# Patient Record
Sex: Male | Born: 1965 | Race: White | Hispanic: No | Marital: Married | Smoking: Never smoker
Health system: Southern US, Community
[De-identification: ages and names within clinical notes are randomized; demographics above are authoritative.]

## PROBLEM LIST (undated history)

## (undated) DIAGNOSIS — E785 Hyperlipidemia, unspecified: Secondary | ICD-10-CM

## (undated) DIAGNOSIS — I10 Essential (primary) hypertension: Secondary | ICD-10-CM

## (undated) HISTORY — PX: POLYPECTOMY: SHX149

## (undated) HISTORY — DX: Hyperlipidemia, unspecified: E78.5

## (undated) HISTORY — DX: Essential (primary) hypertension: I10

## (undated) HISTORY — PX: OTHER SURGICAL HISTORY: SHX169

---

## 2005-05-31 ENCOUNTER — Ambulatory Visit: Payer: Self-pay | Admitting: Internal Medicine

## 2005-06-07 ENCOUNTER — Ambulatory Visit: Payer: Self-pay | Admitting: Internal Medicine

## 2005-06-16 ENCOUNTER — Encounter: Payer: Self-pay | Admitting: Internal Medicine

## 2005-06-16 ENCOUNTER — Ambulatory Visit: Payer: Self-pay

## 2005-07-05 ENCOUNTER — Ambulatory Visit: Payer: Self-pay | Admitting: Internal Medicine

## 2005-07-29 ENCOUNTER — Ambulatory Visit: Payer: Self-pay | Admitting: Internal Medicine

## 2007-06-14 DIAGNOSIS — I10 Essential (primary) hypertension: Secondary | ICD-10-CM | POA: Insufficient documentation

## 2007-06-25 ENCOUNTER — Ambulatory Visit: Payer: Self-pay | Admitting: Internal Medicine

## 2007-06-25 LAB — CONVERTED CEMR LAB
ALT: 40 units/L (ref 0–53)
AST: 35 units/L (ref 0–37)
Albumin: 4.3 g/dL (ref 3.5–5.2)
Alkaline Phosphatase: 74 units/L (ref 39–117)
BUN: 12 mg/dL (ref 6–23)
Basophils Absolute: 0 10*3/uL (ref 0.0–0.1)
Basophils Relative: 0.6 % (ref 0.0–1.0)
Blood in Urine, dipstick: NEGATIVE
CO2: 31 meq/L (ref 19–32)
Chloride: 101 meq/L (ref 96–112)
Cholesterol: 232 mg/dL (ref 0–200)
Creatinine, Ser: 0.8 mg/dL (ref 0.4–1.5)
Glucose, Urine, Semiquant: NEGATIVE
HCT: 45.5 % (ref 39.0–52.0)
MCHC: 35 g/dL (ref 30.0–36.0)
Monocytes Relative: 9 % (ref 3.0–11.0)
RBC: 4.87 M/uL (ref 4.22–5.81)
RDW: 11.8 % (ref 11.5–14.6)
Total Bilirubin: 1.1 mg/dL (ref 0.3–1.2)
Total CHOL/HDL Ratio: 4.4
Triglycerides: 106 mg/dL (ref 0–149)
Urobilinogen, UA: 0.2
VLDL: 21 mg/dL (ref 0–40)
WBC Urine, dipstick: NEGATIVE
pH: 8.5

## 2007-07-02 ENCOUNTER — Ambulatory Visit: Payer: Self-pay | Admitting: Internal Medicine

## 2007-07-02 DIAGNOSIS — M1990 Unspecified osteoarthritis, unspecified site: Secondary | ICD-10-CM | POA: Insufficient documentation

## 2007-07-02 DIAGNOSIS — E785 Hyperlipidemia, unspecified: Secondary | ICD-10-CM

## 2009-07-22 ENCOUNTER — Ambulatory Visit: Payer: Self-pay | Admitting: Internal Medicine

## 2009-07-22 LAB — CONVERTED CEMR LAB
AST: 35 units/L (ref 0–37)
Alkaline Phosphatase: 73 units/L (ref 39–117)
Basophils Absolute: 0 10*3/uL (ref 0.0–0.1)
Blood in Urine, dipstick: NEGATIVE
Calcium: 9.3 mg/dL (ref 8.4–10.5)
GFR calc non Af Amer: 112.89 mL/min (ref >60)
Glucose, Bld: 84 mg/dL (ref 70–99)
HDL: 47.5 mg/dL (ref >39.00)
Hemoglobin: 15.5 g/dL (ref 13.0–17.0)
Ketones, urine, test strip: NEGATIVE
Lymphocytes Relative: 29.2 % (ref 12.0–46.0)
Monocytes Relative: 7.3 % (ref 3.0–12.0)
Neutro Abs: 3.5 10*3/uL (ref 1.4–7.7)
Nitrite: NEGATIVE
Platelets: 201 10*3/uL (ref 150.0–400.0)
Protein, U semiquant: NEGATIVE
RDW: 11.7 % (ref 11.5–14.6)
Sodium: 138 meq/L (ref 135–145)
Specific Gravity, Urine: 1.02
Total Bilirubin: 0.9 mg/dL (ref 0.3–1.2)
Total CHOL/HDL Ratio: 5
Triglycerides: 134 mg/dL (ref 0.0–149.0)
Urobilinogen, UA: 0.2
VLDL: 26.8 mg/dL (ref 0.0–40.0)

## 2009-07-28 ENCOUNTER — Ambulatory Visit: Payer: Self-pay | Admitting: Internal Medicine

## 2010-01-13 ENCOUNTER — Ambulatory Visit: Payer: Self-pay | Admitting: Internal Medicine

## 2010-01-13 LAB — CONVERTED CEMR LAB
Direct LDL: 151.5 mg/dL
HDL: 54 mg/dL (ref >39.00)
Total Bilirubin: 1 mg/dL (ref 0.3–1.2)
Total CHOL/HDL Ratio: 4
Triglycerides: 160 mg/dL — ABNORMAL HIGH (ref 0.0–149.0)

## 2010-01-20 ENCOUNTER — Ambulatory Visit: Payer: Self-pay | Admitting: Internal Medicine

## 2010-01-20 LAB — CONVERTED CEMR LAB
Cholesterol, target level: 200 mg/dL
HDL goal, serum: 40 mg/dL

## 2010-06-07 ENCOUNTER — Ambulatory Visit: Payer: Self-pay | Admitting: Internal Medicine

## 2010-06-07 LAB — CONVERTED CEMR LAB
ALT: 37 units/L (ref 0–53)
Alkaline Phosphatase: 73 units/L (ref 39–117)
Bilirubin, Direct: 0.1 mg/dL (ref 0.0–0.3)
Cholesterol: 156 mg/dL (ref 0–200)
Total Protein: 6.9 g/dL (ref 6.0–8.3)

## 2010-06-18 ENCOUNTER — Ambulatory Visit: Payer: Self-pay | Admitting: Internal Medicine

## 2010-06-18 DIAGNOSIS — L738 Other specified follicular disorders: Secondary | ICD-10-CM

## 2010-09-07 NOTE — Assessment & Plan Note (Signed)
Summary: 6 month rov/njr   Vital Signs:  Patient profile:   45 year old male Height:      67 inches Weight:      176 pounds BMI:     27.67 Temp:     98.2 degrees F oral Pulse rate:   76 / minute Resp:     14 per minute BP sitting:   136 / 86  (left arm)  Vitals Entered By: Willy Eddy, LPN (January 20, 2010 9:12 AM)  Nutrition Counseling: Patient's BMI is greater than 25 and therefore counseled on weight management options. CC: roa labs, Lipid Management   Primary Care Provider:  Stacie Glaze MD  CC:  roa labs and Lipid Management.  History of Present Illness:  Hyperlipidemia Follow-Up      This is a 45 year old man who presents for Hyperlipidemia follow-up.  The patient denies muscle aches, GI upset, abdominal pain, flushing, itching, constipation, diarrhea, and fatigue.  The patient denies the following symptoms: chest pain/pressure, exercise intolerance, dypsnea, palpitations, syncope, and pedal edema.  Compliance with medications (by patient report) has been near 100%.  Dietary compliance has been good.  The patient reports exercising 3-4X per week.    Lipid Management History:      Positive NCEP/ATP III risk factors include hypertension.  Negative NCEP/ATP III risk factors include male age less than 32 years old, no family history for ischemic heart disease, and non-tobacco-user status.     Preventive Screening-Counseling & Management  Alcohol-Tobacco     Smoking Status: never     Passive Smoke Exposure: no  Current Problems (verified): 1)  Hyperlipidemia  (ICD-272.4) 2)  Osteoarthritis  (ICD-715.90) 3)  Physical Examination  (ICD-V70.0) 4)  Hypertension  (ICD-401.9)  Current Medications (verified): 1)  Crestor 20 Mg Tabs (Rosuvastatin Calcium) .... One By Mouth Every Friday Night  Allergies (verified): No Known Drug Allergies  Past History:  Family History: Last updated: 07/02/2007 father alive and well mother CVA HTN  Social History: Last  updated: 07/02/2007 Married Never Smoked Alcohol use-no Regular exercise-yes  Risk Factors: Caffeine Use: 1 (07/02/2007) Exercise: yes (07/02/2007)  Risk Factors: Smoking Status: never (01/20/2010) Passive Smoke Exposure: no (01/20/2010)  Past medical, surgical, family and social histories (including risk factors) reviewed, and no changes noted (except as noted below).  Past Medical History: Reviewed history from 07/02/2007 and no changes required. Hypertension Osteoarthritis Hyperlipidemia  Past Surgical History: Reviewed history from 07/02/2007 and no changes required. fx of wrists  Family History: Reviewed history from 07/02/2007 and no changes required. father alive and well mother CVA HTN  Social History: Reviewed history from 07/02/2007 and no changes required. Married Never Smoked Alcohol use-no Regular exercise-yes  Review of Systems  The patient denies anorexia, fever, weight loss, weight gain, vision loss, decreased hearing, hoarseness, chest pain, syncope, dyspnea on exertion, peripheral edema, prolonged cough, headaches, hemoptysis, abdominal pain, melena, hematochezia, severe indigestion/heartburn, hematuria, incontinence, genital sores, muscle weakness, suspicious skin lesions, transient blindness, difficulty walking, depression, unusual weight change, abnormal bleeding, enlarged lymph nodes, angioedema, and breast masses.    Physical Exam  General:  muscular WM Head:  Normocephalic and atraumatic without obvious abnormalities. No apparent alopecia or balding. Eyes:  pupils equal and pupils round.   Ears:  R ear normal and L ear normal.   Nose:  no external deformity and no nasal discharge.   Mouth:  good dentition and pharynx pink and moist.   Neck:  supple.   Lungs:  normal  respiratory effort and no wheezes.   Abdomen:  soft and non-tender.   Msk:  normal ROM, no joint tenderness, and no joint swelling.   Neurologic:  alert & oriented X3 and  DTRs symmetrical and normal.     Impression & Recommendations:  Problem # 1:  HYPERLIPIDEMIA (ICD-272.4)  His updated medication list for this problem includes:    Crestor 20 Mg Tabs (Rosuvastatin calcium) ..... One by mouth every friday night  Labs Reviewed: SGOT: 26 (01/13/2010)   SGPT: 38 (01/13/2010)  Lipid Goals: Chol Goal: 200 (01/20/2010)   HDL Goal: 40 (01/20/2010)   LDL Goal: 160 (01/20/2010)   TG Goal: 150 (01/20/2010)  10 Yr Risk Heart Disease: Not enough information   HDL:54.00 (01/13/2010), 47.50 (07/22/2009)  LDL:DEL (06/25/2007)  Chol:238 (01/13/2010), 244 (07/22/2009)  Trig:160.0 (01/13/2010), 134.0 (07/22/2009)  Problem # 2:  HYPERTENSION (ICD-401.9)  BP today: 136/86 Prior BP: 148/88 (07/28/2009)  10 Yr Risk Heart Disease: Not enough information  Labs Reviewed: K+: 4.5 (07/22/2009) Creat: : 0.8 (07/22/2009)   Chol: 238 (01/13/2010)   HDL: 54.00 (01/13/2010)   LDL: DEL (06/25/2007)   TG: 160.0 (01/13/2010)  Problem # 3:  OSTEOARTHRITIS (ICD-715.90)  Complete Medication List: 1)  Crestor 20 Mg Tabs (Rosuvastatin calcium) .... One by mouth every friday night  Lipid Assessment/Plan:      Based on NCEP/ATP III, the patient's risk factor category is "0-1 risk factors".  The patient's lipid goals are as follows: Total cholesterol goal is 200; LDL cholesterol goal is 160; HDL cholesterol goal is 40; Triglyceride goal is 150.    Patient Instructions: 1)  Please schedule a follow-up appointment in 3 months. 2)  Hepatic Panel prior to visit, ICD-9:995.20 3)  Lipid Panel prior to visit, ICD-9:272.4

## 2010-09-07 NOTE — Assessment & Plan Note (Signed)
Summary: 3 month fup//ccm/pt rescd//ccm   Vital Signs:  Patient profile:   45 year old male Height:      67 inches Weight:      175 pounds BMI:     27.51 Temp:     98.2 degrees F oral Pulse rate:   76 / minute Resp:     14 per minute BP sitting:   150 / 80  (left arm)  Vitals Entered By: Willy Eddy, LPN (June 18, 2010 10:16 AM) CC: roa labs, Lipid Management Is Patient Diabetic? No   Primary Care Izeah Vossler:  Stacie Glaze MD  CC:  roa labs and Lipid Management.  History of Present Illness: has missed some pills here and there has been eating better has been exercizing  notes folliculitis in the groin has been present fro 30 days and is inflamed  Lipid Management History:      Positive NCEP/ATP III risk factors include hypertension.  Negative NCEP/ATP III risk factors include male age less than 30 years old, no family history for ischemic heart disease, non-tobacco-user status, no ASHD (atherosclerotic heart disease), no prior stroke/TIA, no peripheral vascular disease, and no history of aortic aneurysm.     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 One By Mouth Every Friday Night  Current Medications (verified): 1)  Crestor 20 Mg Tabs (Rosuvastatin Calcium) .... One By Mouth Every Friday Night 2)  Doxycycline Hyclate 100 Mg Caps (Doxycycline Hyclate) .... One By Mouth Two Times A Day  Allergies (verified): No Known Drug  Allergies  Past History:  Family History: Last updated: 07/02/2007 father alive and well mother CVA HTN  Social History: Last updated: 07/02/2007 Married Never Smoked Alcohol use-no Regular exercise-yes  Risk Factors: Caffeine Use: 1 (07/02/2007) Exercise: yes (07/02/2007)  Risk Factors: Smoking Status: never (06/18/2010) Passive Smoke Exposure: no (06/18/2010)  Past medical, surgical, family and social histories (including risk factors) reviewed, and no changes noted (except as noted below).  Past Medical History: Reviewed history from 07/02/2007 and no changes required. Hypertension Osteoarthritis Hyperlipidemia  Past Surgical History: Reviewed history from 07/02/2007 and no changes required. fx of wrists  Family History: Reviewed history from 07/02/2007 and no changes required. father alive and well mother CVA HTN  Social History: Reviewed history from 07/02/2007 and no changes required. Married Never Smoked Alcohol use-no Regular exercise-yes  Review of Systems  The patient denies anorexia, fever, weight loss, weight gain, vision loss, decreased hearing, hoarseness, chest pain, syncope, dyspnea on exertion, peripheral edema, prolonged cough, headaches, hemoptysis, abdominal pain, melena, hematochezia, severe indigestion/heartburn, hematuria, incontinence, genital sores, muscle weakness, suspicious skin lesions, transient blindness, difficulty walking, depression, unusual weight change, abnormal bleeding, enlarged lymph nodes, angioedema, breast masses, and testicular masses.         Flu Vaccine Consent Questions     Do you have a history of severe allergic reactions to this vaccine? no    Any prior history of allergic reactions to egg and/or gelatin? no    Do  you have a sensitivity to the preservative Thimersol? no    Do you have a past history of Guillan-Barre Syndrome? no    Do you currently have an acute febrile illness? no    Have you ever had a  severe reaction to latex? no    Vaccine information given and explained to patient? yes    Are you currently pregnant? no    Lot Number:[id number]   Exp Date:02/05/2011   Site Given  Left Deltoid IM   Physical Exam  General:  muscular WM Head:  Normocephalic and atraumatic without obvious abnormalities. No apparent alopecia or balding. Ears:  R ear normal and L ear normal.   Nose:  no external deformity and no nasal discharge.   Mouth:  good dentition and pharynx pink and moist.   Neck:  supple.   Lungs:  normal respiratory effort and no wheezes.   Abdomen:  soft and non-tender.   Msk:  normal ROM, no joint tenderness, and no joint swelling.     Impression & Recommendations:  Problem # 1:  FOLLICULITIS, CHRONIC (ICD-704.8) discussion of use of alpha hydroxy soaps  Problem # 2:  HYPERLIPIDEMIA (ICD-272.4)  His updated medication list for this problem includes:    Crestor 20 Mg Tabs (Rosuvastatin calcium) ..... One by mouth every friday night  Labs Reviewed: SGOT: 35 (06/07/2010)   SGPT: 37 (06/07/2010)  Lipid Goals: Chol Goal: 200 (01/20/2010)   HDL Goal: 40 (01/20/2010)   LDL Goal: 160 (01/20/2010)   TG Goal: 150 (01/20/2010)  10 Yr Risk Heart Disease: 4 % Prior 10 Yr Risk Heart Disease: Not enough information (01/20/2010)   HDL:48.20 (06/07/2010), 54.00 (01/13/2010)  LDL:88 (06/07/2010), DEL (37/16/9678)  Chol:156 (06/07/2010), 238 (01/13/2010)  Trig:98.0 (06/07/2010), 160.0 (01/13/2010)  Problem # 3:  HYPERTENSION (ICD-401.9)  BP today: 150/80 Prior BP: 136/86 (01/20/2010)  10 Yr Risk Heart Disease: 4 % Prior 10 Yr Risk Heart Disease: Not enough information (01/20/2010)  Labs Reviewed: K+: 4.5 (07/22/2009) Creat: : 0.8 (07/22/2009)   Chol: 156 (06/07/2010)   HDL: 48.20 (06/07/2010)   LDL: 88 (06/07/2010)   TG: 98.0 (06/07/2010)  Complete Medication List: 1)  Crestor 20 Mg Tabs (Rosuvastatin calcium) .... One by mouth every friday night 2)  Doxycycline Hyclate  100 Mg Caps (Doxycycline hyclate) .... One by mouth two times a day  Other Orders: Admin 1st Vaccine (93810) Flu Vaccine 57yrs + (17510)  Lipid Assessment/Plan:      Based on NCEP/ATP III, the patient's risk factor category is "0-1 risk factors".  The patient's lipid goals are as follows: Total cholesterol goal is 200; LDL cholesterol goal is 160; HDL cholesterol goal is 40; Triglyceride goal is 150.    Patient Instructions: 1)  Please schedule a follow-up appointment in 6 months.  CPX Prescriptions: DOXYCYCLINE HYCLATE 100 MG CAPS (DOXYCYCLINE HYCLATE) one by mouth two times a day  #42 x 0   Entered and Authorized by:   Stacie Glaze MD   Signed by:   Stacie Glaze MD on 06/18/2010   Method used:   Electronically to        [facility] [phone]* (retail)       [address]       [place], [place]  [address]       Ph: [phone]       Fax: [phone]   RxID:   [id number]    Orders Added: 1)  Admin 1st Vaccine [90471] 2)  Flu Vaccine 28yrs + [90658] 3)  Est. Patient Level IV [16109]

## 2010-12-09 ENCOUNTER — Other Ambulatory Visit: Payer: Self-pay

## 2010-12-10 ENCOUNTER — Encounter: Payer: Self-pay | Admitting: Internal Medicine

## 2010-12-11 ENCOUNTER — Emergency Department: Payer: Self-pay | Admitting: Emergency Medicine

## 2010-12-16 ENCOUNTER — Encounter: Payer: Self-pay | Admitting: Internal Medicine

## 2011-02-02 ENCOUNTER — Other Ambulatory Visit: Payer: Self-pay | Admitting: Internal Medicine

## 2011-02-02 ENCOUNTER — Telehealth: Payer: Self-pay | Admitting: Internal Medicine

## 2011-02-02 DIAGNOSIS — N492 Inflammatory disorders of scrotum: Secondary | ICD-10-CM

## 2011-02-02 NOTE — Telephone Encounter (Signed)
His boil has come back. Dr Lovell Sheehan told patient that if that happens, then a urologist referral would be needed. Ok to order?

## 2011-02-02 NOTE — Telephone Encounter (Signed)
Talked with pt and he has boil that has returned on scrotum- referral to urologist per dr Lovell Sheehan

## 2011-03-15 ENCOUNTER — Other Ambulatory Visit (INDEPENDENT_AMBULATORY_CARE_PROVIDER_SITE_OTHER): Payer: BC Managed Care – PPO

## 2011-03-15 DIAGNOSIS — Z0000 Encounter for general adult medical examination without abnormal findings: Secondary | ICD-10-CM

## 2011-03-15 LAB — HEPATIC FUNCTION PANEL
Alkaline Phosphatase: 64 U/L (ref 39–117)
Bilirubin, Direct: 0 mg/dL (ref 0.0–0.3)
Total Bilirubin: 1 mg/dL (ref 0.3–1.2)
Total Protein: 7.5 g/dL (ref 6.0–8.3)

## 2011-03-15 LAB — POCT URINALYSIS DIPSTICK
Bilirubin, UA: NEGATIVE
Blood, UA: NEGATIVE
Nitrite, UA: NEGATIVE
Protein, UA: NEGATIVE
pH, UA: 5.5

## 2011-03-15 LAB — CBC WITH DIFFERENTIAL/PLATELET
Basophils Absolute: 0 10*3/uL (ref 0.0–0.1)
Eosinophils Absolute: 0.2 10*3/uL (ref 0.0–0.7)
Lymphocytes Relative: 23.1 % (ref 12.0–46.0)
MCHC: 34.2 g/dL (ref 30.0–36.0)
MCV: 94.3 fl (ref 78.0–100.0)
Monocytes Absolute: 0.4 10*3/uL (ref 0.1–1.0)
Neutrophils Relative %: 63.9 % (ref 43.0–77.0)
Platelets: 166 10*3/uL (ref 150.0–400.0)
RDW: 12.8 % (ref 11.5–14.6)

## 2011-03-15 LAB — BASIC METABOLIC PANEL
BUN: 19 mg/dL (ref 6–23)
CO2: 26 mEq/L (ref 19–32)
Calcium: 8.8 mg/dL (ref 8.4–10.5)
Chloride: 105 mEq/L (ref 96–112)
Creatinine, Ser: 0.9 mg/dL (ref 0.4–1.5)

## 2011-03-15 LAB — LIPID PANEL
HDL: 56.6 mg/dL (ref >39.00)
LDL Cholesterol: 94 mg/dL (ref 0–99)
Total CHOL/HDL Ratio: 3
Triglycerides: 162 mg/dL — ABNORMAL HIGH (ref 0.0–149.0)
VLDL: 32.4 mg/dL (ref 0.0–40.0)

## 2011-03-23 ENCOUNTER — Ambulatory Visit (INDEPENDENT_AMBULATORY_CARE_PROVIDER_SITE_OTHER): Payer: BC Managed Care – PPO | Admitting: Internal Medicine

## 2011-03-23 ENCOUNTER — Encounter: Payer: Self-pay | Admitting: Internal Medicine

## 2011-03-23 VITALS — BP 154/90 | HR 72 | Temp 98.2°F | Resp 14 | Ht 67.0 in | Wt 178.0 lb

## 2011-03-23 DIAGNOSIS — Z136 Encounter for screening for cardiovascular disorders: Secondary | ICD-10-CM

## 2011-03-23 DIAGNOSIS — Z0000 Encounter for general adult medical examination without abnormal findings: Secondary | ICD-10-CM

## 2011-03-23 DIAGNOSIS — N492 Inflammatory disorders of scrotum: Secondary | ICD-10-CM | POA: Insufficient documentation

## 2011-03-23 DIAGNOSIS — N498 Inflammatory disorders of other specified male genital organs: Secondary | ICD-10-CM

## 2011-03-23 MED ORDER — DOXYCYCLINE HYCLATE 100 MG PO TABS: 100.0000 mg | ORAL_TABLET | Freq: Two times a day (BID) | ORAL | Status: AC

## 2011-03-23 NOTE — Progress Notes (Signed)
  Subjective:    Patient ID: Preston Williams, male    DOB: Dec 05, 1965, 45 y.o.   MRN: [id number]  HPI Patient is a 45 year old white male presents for complete physical examination he is a healthy bodybuilder no acute complaint With the exception of a tenderness of the lower part of the scrotum that has been previously diagnosed as a sebaceous cyst or an infected sebaceous cyst.  He was referred to urology who treated him with antibiotics he continues to drain there was some mention in the notes of a fistulous tract but I cannot detect any tract and examination of the scrotum scrotum it appears to be in the superficial layer of the scrotum consistent more with an infected cyst   Review of Systems  Constitutional: Negative for fever and fatigue.  HENT: Negative for hearing loss, congestion, neck pain and postnasal drip.   Eyes: Negative for discharge, redness and visual disturbance.  Respiratory: Negative for cough, shortness of breath and wheezing.   Cardiovascular: Negative for leg swelling.  Gastrointestinal: Negative for abdominal pain, constipation and abdominal distention.  Genitourinary: Negative for urgency and frequency.  Musculoskeletal: Negative for joint swelling and arthralgias.  Skin: Negative for color change and rash.  Neurological: Negative for weakness and light-headedness.  Hematological: Negative for adenopathy.  Psychiatric/Behavioral: Negative for behavioral problems.       Objective:   Physical Exam  Constitutional: He is oriented to person, place, and time. He appears well-developed and well-nourished.  HENT:  Head: Normocephalic and atraumatic.  Eyes: Conjunctivae are normal. Pupils are equal, round, and reactive to light.  Neck: Normal range of motion. Neck supple.  Cardiovascular: Normal rate and regular rhythm.   Pulmonary/Chest: Effort normal and breath sounds normal.  Abdominal: Soft. Bowel sounds are normal.  Genitourinary: Rectum normal, prostate  normal and penis normal.       Base of scrotum a 2 cm inflamed area that is draining purulent material with a shallow ulcer top  Musculoskeletal: Normal range of motion.  Neurological: He is alert and oriented to person, place, and time. He has normal reflexes.  Skin: Skin is warm and dry.       Tattooed on left shoulder          Assessment & Plan:  Scrotal abscess I believe the patient should keep his appointment with urology for IND of the site this may be ingrown hair follicle versus a sebaceous cyst we will place him on a different antibiotic I think the doxycycline 100 mg by mouth twice a day would be sufficient and we'll leave him on a 30 day course until he can see the urologist.   Patient presents for yearly preventative medicine examination.   all immunizations and health maintenance protocols were reviewed with the patient and they are up to date with these protocols.   screening laboratory values were reviewed with the patient including screening of hyperlipidemia PSA renal function and hepatic function.   There medications past medical history social history problem list and allergies were reviewed in detail.   Goals were established with regard to weight loss exercise diet in compliance with medications

## 2011-03-26 LAB — WOUND CULTURE: Gram Stain: NONE SEEN

## 2011-04-06 ENCOUNTER — Other Ambulatory Visit: Payer: Self-pay | Admitting: *Deleted

## 2011-04-06 MED ORDER — FLUCONAZOLE 100 MG PO TABS: 100.0000 mg | ORAL_TABLET | Freq: Every day | ORAL | Status: AC

## 2011-05-17 ENCOUNTER — Ambulatory Visit: Payer: BC Managed Care – PPO | Admitting: Internal Medicine

## 2012-03-16 ENCOUNTER — Other Ambulatory Visit (INDEPENDENT_AMBULATORY_CARE_PROVIDER_SITE_OTHER): Payer: BC Managed Care – PPO

## 2012-03-16 DIAGNOSIS — Z0000 Encounter for general adult medical examination without abnormal findings: Secondary | ICD-10-CM

## 2012-03-16 LAB — CBC WITH DIFFERENTIAL/PLATELET
Basophils Absolute: 0 10*3/uL (ref 0.0–0.1)
Basophils Relative: 0.5 % (ref 0.0–3.0)
Eosinophils Absolute: 0.1 10*3/uL (ref 0.0–0.7)
Lymphocytes Relative: 24.3 % (ref 12.0–46.0)
MCHC: 34 g/dL (ref 30.0–36.0)
MCV: 92.6 fl (ref 78.0–100.0)
Monocytes Absolute: 0.5 10*3/uL (ref 0.1–1.0)
Neutrophils Relative %: 63.6 % (ref 43.0–77.0)
RBC: 4.87 Mil/uL (ref 4.22–5.81)
RDW: 12.6 % (ref 11.5–14.6)

## 2012-03-16 LAB — LIPID PANEL
Cholesterol: 232 mg/dL — ABNORMAL HIGH (ref 0–200)
HDL: 52.7 mg/dL (ref >39.00)
Total CHOL/HDL Ratio: 4
Triglycerides: 123 mg/dL (ref 0.0–149.0)

## 2012-03-16 LAB — BASIC METABOLIC PANEL
BUN: 17 mg/dL (ref 6–23)
CO2: 26 mEq/L (ref 19–32)
Calcium: 9 mg/dL (ref 8.4–10.5)
Chloride: 103 mEq/L (ref 96–112)
Creatinine, Ser: 0.8 mg/dL (ref 0.4–1.5)
Glucose, Bld: 87 mg/dL (ref 70–99)

## 2012-03-16 LAB — POCT URINALYSIS DIPSTICK
Bilirubin, UA: NEGATIVE
Blood, UA: NEGATIVE
Ketones, UA: NEGATIVE
Protein, UA: NEGATIVE
pH, UA: 6

## 2012-03-16 LAB — HEPATIC FUNCTION PANEL
Albumin: 4.2 g/dL (ref 3.5–5.2)
Alkaline Phosphatase: 62 U/L (ref 39–117)
Bilirubin, Direct: 0.1 mg/dL (ref 0.0–0.3)
Total Protein: 7.4 g/dL (ref 6.0–8.3)

## 2012-03-23 ENCOUNTER — Ambulatory Visit (INDEPENDENT_AMBULATORY_CARE_PROVIDER_SITE_OTHER): Payer: BC Managed Care – PPO | Admitting: Internal Medicine

## 2012-03-23 ENCOUNTER — Encounter: Payer: Self-pay | Admitting: Internal Medicine

## 2012-03-23 VITALS — BP 136/80 | HR 80 | Temp 98.2°F | Resp 16 | Ht 66.0 in | Wt 178.0 lb

## 2012-03-23 DIAGNOSIS — Z0000 Encounter for general adult medical examination without abnormal findings: Secondary | ICD-10-CM

## 2012-03-23 DIAGNOSIS — E785 Hyperlipidemia, unspecified: Secondary | ICD-10-CM

## 2012-03-23 NOTE — Patient Instructions (Signed)
The patient is instructed to continue all medications as prescribed. Schedule followup with check out clerk upon leaving the clinic  

## 2012-03-23 NOTE — Progress Notes (Signed)
  Subjective:    Patient ID: Preston Williams, male    DOB: 1965/10/05, 46 y.o.   MRN: [id number]  HPI  CPX Blood work in advance Has not been on the crestor   Review of Systems  Constitutional: Negative for fever and fatigue.  HENT: Negative for hearing loss, congestion, neck pain and postnasal drip.   Eyes: Negative for discharge, redness and visual disturbance.  Respiratory: Negative for cough, shortness of breath and wheezing.   Cardiovascular: Negative for leg swelling.  Gastrointestinal: Negative for abdominal pain, constipation and abdominal distention.  Genitourinary: Negative for urgency and frequency.  Musculoskeletal: Negative for joint swelling and arthralgias.  Skin: Negative for color change and rash.  Neurological: Negative for weakness and light-headedness.  Hematological: Negative for adenopathy.  Psychiatric/Behavioral: Negative for behavioral problems.   Past Medical History  Diagnosis Date  . Hypertension   . Arthritis   . Hyperlipidemia     History   Social History  . Marital Status: Married    Spouse Name: N/A    Number of Children: N/A  . Years of Education: N/A   Occupational History  . Not on file.   Social History Main Topics  . Smoking status: Former Games developer  . Smokeless tobacco: Not on file  . Alcohol Use: No  . Drug Use: No  . Sexually Active: Yes   Other Topics Concern  . Not on file   Social History Narrative  . No narrative on file    Past Surgical History  Procedure Date  . Fx of wrist     Family History  Problem Relation Age of Onset  . Stroke Mother   . Hypertension Mother   . Heart disease Father     cabg    No Known Allergies  Current Outpatient Prescriptions on File Prior to Visit  Medication Sig Dispense Refill  . rosuvastatin (CRESTOR) 20 MG tablet Take 20 mg by mouth once a week.          BP 136/80  Pulse 80  Temp 98.2 F (36.8 C)  Resp 16  Ht 5\' 6"  (1.676 m)  Wt 178 lb (80.74 kg)  BMI 28.73  kg/m2       Objective:   Physical Exam  Nursing note and vitals reviewed. Constitutional: He is oriented to person, place, and time. He appears well-developed and well-nourished.  HENT:  Head: Normocephalic and atraumatic.  Eyes: Conjunctivae are normal. Pupils are equal, round, and reactive to light.  Neck: Normal range of motion. Neck supple.  Cardiovascular: Normal rate and regular rhythm.   Pulmonary/Chest: Effort normal and breath sounds normal.  Abdominal: Soft. Bowel sounds are normal.  Genitourinary: Rectum normal and prostate normal.  Musculoskeletal: Normal range of motion.  Neurological: He is alert and oriented to person, place, and time.  Skin: Skin is warm and dry.  Psychiatric: He has a normal mood and affect. His behavior is normal.          Assessment & Plan:   Patient presents for yearly preventative medicine examination.   all immunizations and health maintenance protocols were reviewed with the patient and they are up to date with these protocols.   screening laboratory values were reviewed with the patient including screening of hyperlipidemia PSA renal function and hepatic function.   There medications past medical history social history problem list and allergies were reviewed in detail.   Goals were established with regard to weight loss exercise diet in compliance with medications

## 2012-06-21 ENCOUNTER — Other Ambulatory Visit: Payer: BC Managed Care – PPO

## 2012-06-25 ENCOUNTER — Other Ambulatory Visit (INDEPENDENT_AMBULATORY_CARE_PROVIDER_SITE_OTHER): Payer: BC Managed Care – PPO

## 2012-06-25 DIAGNOSIS — E785 Hyperlipidemia, unspecified: Secondary | ICD-10-CM

## 2012-06-25 LAB — LDL CHOLESTEROL, DIRECT: Direct LDL: 165.8 mg/dL

## 2012-06-25 LAB — LIPID PANEL
HDL: 56.2 mg/dL (ref >39.00)
Total CHOL/HDL Ratio: 5
Triglycerides: 162 mg/dL — ABNORMAL HIGH (ref 0.0–149.0)

## 2012-06-26 ENCOUNTER — Telehealth: Payer: Self-pay | Admitting: Family Medicine

## 2012-06-26 ENCOUNTER — Ambulatory Visit: Payer: BC Managed Care – PPO | Admitting: Internal Medicine

## 2012-06-26 NOTE — Telephone Encounter (Signed)
DR Lovell Sheehan INCREASED CRESTOR TO 3 TIMES A WEEK AND REPEAT LIPIDS AND LIVER IN 3 MONTHS WITH ROV THE NEXT WEEK- PT INFORMED AND APPOINTMENTS MADE

## 2012-06-26 NOTE — Telephone Encounter (Signed)
Preston Williams had to cancel at the last minute - family emergency. Because there are no appts w/Dr. Shela Commons until Feb 2014, he wanted to know if you could call him w/his lipid results. I said I didn't know if he would need appt or not. Please review labs and call him on cell. Thank you.

## 2012-09-12 ENCOUNTER — Other Ambulatory Visit: Payer: BC Managed Care – PPO

## 2012-09-13 ENCOUNTER — Other Ambulatory Visit (INDEPENDENT_AMBULATORY_CARE_PROVIDER_SITE_OTHER): Payer: BC Managed Care – PPO

## 2012-09-13 DIAGNOSIS — E785 Hyperlipidemia, unspecified: Secondary | ICD-10-CM

## 2012-09-13 LAB — HEPATIC FUNCTION PANEL
AST: 34 U/L (ref 0–37)
Albumin: 3.9 g/dL (ref 3.5–5.2)
Total Bilirubin: 0.7 mg/dL (ref 0.3–1.2)

## 2012-09-13 LAB — LIPID PANEL
HDL: 45 mg/dL (ref >39.00)
LDL Cholesterol: 116 mg/dL — ABNORMAL HIGH (ref 0–99)
Total CHOL/HDL Ratio: 4
VLDL: 21.6 mg/dL (ref 0.0–40.0)

## 2012-09-19 ENCOUNTER — Ambulatory Visit (INDEPENDENT_AMBULATORY_CARE_PROVIDER_SITE_OTHER): Payer: BC Managed Care – PPO | Admitting: Internal Medicine

## 2012-09-19 ENCOUNTER — Ambulatory Visit: Payer: BC Managed Care – PPO | Admitting: Internal Medicine

## 2012-09-19 ENCOUNTER — Encounter: Payer: Self-pay | Admitting: Internal Medicine

## 2012-09-19 VITALS — BP 146/90 | HR 76 | Temp 98.2°F | Resp 16 | Ht 66.0 in | Wt 184.0 lb

## 2012-09-19 DIAGNOSIS — E785 Hyperlipidemia, unspecified: Secondary | ICD-10-CM

## 2012-09-19 DIAGNOSIS — E291 Testicular hypofunction: Secondary | ICD-10-CM

## 2012-09-19 MED ORDER — ROSUVASTATIN CALCIUM 10 MG PO TABS: 10.0000 mg | ORAL_TABLET | ORAL | Status: DC

## 2012-09-19 NOTE — Progress Notes (Signed)
  Subjective:    Patient ID: Preston Williams, male    DOB: May 10, 1966, 47 y.o.   MRN: [id number]  HPI Discussion of testosterone testing Lipid management Discussion of testosterone needs no sex drive issues but has not noted exercise response Discussion of risk benefit     Review of Systems  Constitutional: Negative for fever and fatigue.  HENT: Negative for hearing loss, congestion, neck pain and postnasal drip.   Eyes: Negative for discharge, redness and visual disturbance.  Respiratory: Negative for cough, shortness of breath and wheezing.   Cardiovascular: Negative for leg swelling.  Gastrointestinal: Negative for abdominal pain, constipation and abdominal distention.  Genitourinary: Negative for urgency and frequency.  Musculoskeletal: Negative for joint swelling and arthralgias.  Skin: Negative for color change and rash.  Neurological: Negative for weakness and light-headedness.  Hematological: Negative for adenopathy.  Psychiatric/Behavioral: Negative for behavioral problems.   Past Medical History  Diagnosis Date  . Hypertension   . Arthritis   . Hyperlipidemia     History   Social History  . Marital Status: Married    Spouse Name: N/A    Number of Children: N/A  . Years of Education: N/A   Occupational History  . Not on file.   Social History Main Topics  . Smoking status: Former Games developer  . Smokeless tobacco: Not on file  . Alcohol Use: No  . Drug Use: No  . Sexually Active: Yes   Other Topics Concern  . Not on file   Social History Narrative  . No narrative on file    Past Surgical History  Procedure Laterality Date  . Fx of wrist      Family History  Problem Relation Age of Onset  . Stroke Mother   . Hypertension Mother   . Heart disease Father     cabg    No Known Allergies  No current outpatient prescriptions on file prior to visit.   No current facility-administered medications on file prior to visit.    BP 146/90  Pulse 76   Temp(Src) 98.2 F (36.8 C)  Resp 16  Ht 5\' 6"  (1.676 m)  Wt 184 lb (83.462 kg)  BMI 29.71 kg/m2       Objective:   Physical Exam  Nursing note and vitals reviewed. Constitutional: He appears well-developed and well-nourished.  HENT:  Head: Normocephalic and atraumatic.  Eyes: Conjunctivae are normal. Pupils are equal, round, and reactive to light.  Neck: Normal range of motion. Neck supple.  Cardiovascular: Normal rate and regular rhythm.   Pulmonary/Chest: Effort normal and breath sounds normal.  Abdominal: Soft. Bowel sounds are normal.          Assessment & Plan:   I have spent more than 30 minutes examining this patient face-to-face of which over half was spent in counseling discussion ol lipid goals in context of his risks Continue the crestor Hypogonadism symptoms

## 2012-09-19 NOTE — Patient Instructions (Signed)
The patient is instructed to continue all medications as prescribed. Schedule followup with check out clerk upon leaving the clinic  

## 2012-09-21 LAB — TESTOSTERONE, FREE, TOTAL, SHBG
Sex Hormone Binding: 23 nmol/L (ref 13–71)
Testosterone, Free: 90 pg/mL (ref 47.0–244.0)
Testosterone-% Free: 2.4 % (ref 1.6–2.9)
Testosterone: 369 ng/dL (ref 300–890)

## 2012-09-22 ENCOUNTER — Other Ambulatory Visit: Payer: Self-pay

## 2012-09-26 ENCOUNTER — Encounter: Payer: Self-pay | Admitting: Internal Medicine

## 2012-09-26 ENCOUNTER — Other Ambulatory Visit: Payer: Self-pay | Admitting: Internal Medicine

## 2012-09-26 DIAGNOSIS — E291 Testicular hypofunction: Secondary | ICD-10-CM

## 2012-09-26 MED ORDER — TESTOSTERONE 30 MG/ACT TD SOLN: 1.0000 | Freq: Every day | TRANSDERMAL | Status: DC

## 2012-10-01 MED ORDER — TESTOSTERONE CYPIONATE 200 MG/ML IM SOLN: 100.0000 mg | INTRAMUSCULAR | Status: DC

## 2012-10-01 NOTE — Telephone Encounter (Signed)
Pt has concerns about using gel around family members. Also has read info about gel and some inconsistencies with the gel. Would like to consider injections. Pt would like to speak w/ you about this.

## 2012-10-01 NOTE — Addendum Note (Signed)
Addended by: Stacie Glaze on: 10/01/2012 12:05 PM   Modules accepted: Orders, Medications

## 2012-10-11 ENCOUNTER — Telehealth: Payer: Self-pay | Admitting: Internal Medicine

## 2012-10-11 ENCOUNTER — Other Ambulatory Visit: Payer: Self-pay | Admitting: *Deleted

## 2012-10-11 DIAGNOSIS — E291 Testicular hypofunction: Secondary | ICD-10-CM

## 2012-10-11 MED ORDER — TESTOSTERONE CYPIONATE 200 MG/ML IM SOLN: 100.0000 mg | INTRAMUSCULAR | Status: DC

## 2012-10-11 NOTE — Telephone Encounter (Signed)
Preston Williams, patient's testosterone Preston Williams was denied because his testosterone is not less than 300ng/dL. I sent him a message communicating this via MyChart. Thanks.

## 2012-10-11 NOTE — Telephone Encounter (Signed)
Pt informed and he is going to check around for the cost and [facility] will be [facility] by him

## 2012-10-25 ENCOUNTER — Ambulatory Visit (INDEPENDENT_AMBULATORY_CARE_PROVIDER_SITE_OTHER): Payer: BC Managed Care – PPO | Admitting: *Deleted

## 2012-10-25 DIAGNOSIS — E291 Testicular hypofunction: Secondary | ICD-10-CM

## 2012-10-25 MED ORDER — TESTOSTERONE CYPIONATE 200 MG/ML IM SOLN
100.0000 mg | INTRAMUSCULAR | Status: AC
  Administered 2012-10-25: 100 mg via INTRAMUSCULAR

## 2012-11-09 ENCOUNTER — Ambulatory Visit (INDEPENDENT_AMBULATORY_CARE_PROVIDER_SITE_OTHER): Payer: BC Managed Care – PPO | Admitting: Internal Medicine

## 2012-11-09 DIAGNOSIS — E291 Testicular hypofunction: Secondary | ICD-10-CM

## 2012-11-09 MED ORDER — TESTOSTERONE CYPIONATE 200 MG/ML IM SOLN
100.0000 mg | INTRAMUSCULAR | Status: AC
  Administered 2012-11-09: 100 mg via INTRAMUSCULAR

## 2013-03-20 ENCOUNTER — Other Ambulatory Visit (INDEPENDENT_AMBULATORY_CARE_PROVIDER_SITE_OTHER): Payer: BC Managed Care – PPO

## 2013-03-20 DIAGNOSIS — Z0000 Encounter for general adult medical examination without abnormal findings: Secondary | ICD-10-CM

## 2013-03-20 LAB — CBC WITH DIFFERENTIAL/PLATELET
Basophils Relative: 0.5 % (ref 0.0–3.0)
Eosinophils Absolute: 0.2 10*3/uL (ref 0.0–0.7)
MCHC: 34.3 g/dL (ref 30.0–36.0)
MCV: 93.6 fl (ref 78.0–100.0)
Monocytes Absolute: 0.6 10*3/uL (ref 0.1–1.0)
Neutrophils Relative %: 70.8 % (ref 43.0–77.0)
Platelets: 168 10*3/uL (ref 150.0–400.0)
RBC: 4.79 Mil/uL (ref 4.22–5.81)

## 2013-03-20 LAB — BASIC METABOLIC PANEL
Calcium: 8.9 mg/dL (ref 8.4–10.5)
Creatinine, Ser: 0.8 mg/dL (ref 0.4–1.5)
GFR: 114.27 mL/min (ref >60.00)
Sodium: 139 mEq/L (ref 135–145)

## 2013-03-20 LAB — TSH: TSH: 0.91 u[IU]/mL (ref 0.35–5.50)

## 2013-03-20 LAB — POCT URINALYSIS DIPSTICK
Ketones, UA: NEGATIVE
Leukocytes, UA: NEGATIVE
Nitrite, UA: NEGATIVE
Protein, UA: NEGATIVE
Urobilinogen, UA: 0.2

## 2013-03-20 LAB — HEPATIC FUNCTION PANEL
Albumin: 4.2 g/dL (ref 3.5–5.2)
Bilirubin, Direct: 0.1 mg/dL (ref 0.0–0.3)
Total Protein: 7.4 g/dL (ref 6.0–8.3)

## 2013-03-20 LAB — LIPID PANEL: Cholesterol: 188 mg/dL (ref 0–200)

## 2013-03-27 ENCOUNTER — Ambulatory Visit (INDEPENDENT_AMBULATORY_CARE_PROVIDER_SITE_OTHER): Payer: BC Managed Care – PPO | Admitting: Internal Medicine

## 2013-03-27 ENCOUNTER — Encounter: Payer: Self-pay | Admitting: Internal Medicine

## 2013-03-27 VITALS — BP 140/90 | HR 76 | Temp 98.6°F | Resp 16 | Ht 66.0 in | Wt 181.0 lb

## 2013-03-27 DIAGNOSIS — E785 Hyperlipidemia, unspecified: Secondary | ICD-10-CM

## 2013-03-27 DIAGNOSIS — Z8042 Family history of malignant neoplasm of prostate: Secondary | ICD-10-CM

## 2013-03-27 DIAGNOSIS — Z0000 Encounter for general adult medical examination without abnormal findings: Secondary | ICD-10-CM

## 2013-03-27 DIAGNOSIS — E291 Testicular hypofunction: Secondary | ICD-10-CM

## 2013-03-27 NOTE — Progress Notes (Signed)
Subjective:    Patient ID: Preston Williams, male    DOB: [birth year], 47 y.o.   MRN: [id number]  HPI CPX 47-year-old male who presents for complete physical examination he has a history of hyperlipidemia.  He had screening blood work done prior to his examination patient's kidney function was perfect there is no evidence of diabetes his blood count was excellent with a good hemoglobin hematocrit and platelet count the brachialis cells was normal his liver functions were completely normal his thyroid functions are completely normal his cholesterol was 188 which meets the goal of less than 200 his HDL was 50 which beats a goal of greater than 40 and his LDL C. Was 113 ideally less than 100 is the best cholesterol reading but given his moderate risk factors this is an acceptable level.     Review of Systems  Constitutional: Negative for fever and fatigue.  HENT: Negative for hearing loss, congestion, neck pain and postnasal drip.   Eyes: Negative for discharge, redness and visual disturbance.  Respiratory: Negative for cough, shortness of breath and wheezing.   Cardiovascular: Negative for leg swelling.  Gastrointestinal: Negative for abdominal pain, constipation and abdominal distention.  Genitourinary: Negative for urgency and frequency.  Musculoskeletal: Negative for joint swelling and arthralgias.  Skin: Negative for color change and rash.  Neurological: Negative for weakness and light-headedness.  Hematological: Negative for adenopathy.  Psychiatric/Behavioral: Negative for behavioral problems.   Past Medical History  Diagnosis Date  . Hypertension   . Arthritis   . Hyperlipidemia     History   Social History  . Marital Status: Married    Spouse Name: N/A    Number of Children: N/A  . Years of Education: N/A   Occupational History  . Not on file.   Social History Main Topics  . Smoking status: Former Games developer  . Smokeless tobacco: Not on file  . Alcohol Use: No  . Drug  Use: No  . Sexual Activity: Yes   Other Topics Concern  . Not on file   Social History Narrative  . No narrative on file    Past Surgical History  Procedure Laterality Date  . Fx of wrist      Family History  Problem Relation Age of Onset  . Stroke Mother   . Hypertension Mother   . Heart disease Father     cabg    No Known Allergies  Current Outpatient Prescriptions on File Prior to Visit  Medication Sig Dispense Refill  . rosuvastatin (CRESTOR) 10 MG tablet Take 1 tablet (10 mg total) by mouth 3 (three) times a week. 1 3 times a week       No current facility-administered medications on file prior to visit.    BP 140/90  Pulse 76  Temp(Src) 98.6 F (37 C)  Resp 16  Ht 5\' 6"  (1.676 m)  Wt 181 lb (82.101 kg)  BMI 29.23 kg/m2       Objective:   Physical Exam  Constitutional: He is oriented to person, place, and time. He appears well-developed and well-nourished.  HENT:  Head: Normocephalic and atraumatic.  Eyes: Conjunctivae are normal. Pupils are equal, round, and reactive to light.  Neck: Normal range of motion. Neck supple.  Cardiovascular: Normal rate and regular rhythm.   Pulmonary/Chest: Effort normal and breath sounds normal.  Abdominal: Soft. Bowel sounds are normal.  Genitourinary: Rectum normal and prostate normal.  Neurological: He is alert and oriented to person, place, and time.  Skin:  Skin is warm and dry.  Psychiatric: His behavior is normal.          Assessment & Plan:   Patient presents for yearly preventative medicine examination.   all immunizations and health maintenance protocols were reviewed with the patient and they are up to date with these protocols.   screening laboratory values were reviewed with the patient including screening of hyperlipidemia PSA renal function and hepatic function.   There medications past medical history social history problem list and allergies were reviewed in detail.   Goals were established  with regard to weight loss exercise diet in compliance with medications

## 2013-03-27 NOTE — Patient Instructions (Signed)
Use my chart to look for testosterone levels appear free levels are normal I would not recommend testosterone support other than the [facility] testosterone stimulant Try to take Crestor more regularly Remember to exercise General you're doing excellent and your physical fitness level is great

## 2013-03-28 LAB — TESTOSTERONE, FREE, TOTAL, SHBG
Sex Hormone Binding: 23 nmol/L (ref 13–71)
Testosterone, Free: 121.3 pg/mL (ref 47.0–244.0)
Testosterone-% Free: 2.5 % (ref 1.6–2.9)
Testosterone: 481 ng/dL (ref 300–890)

## 2013-06-13 ENCOUNTER — Other Ambulatory Visit: Payer: Self-pay

## 2014-03-13 ENCOUNTER — Telehealth: Payer: Self-pay | Admitting: Internal Medicine

## 2014-03-13 NOTE — Telephone Encounter (Signed)
Suggest Dr. Yong Channel

## 2014-03-13 NOTE — Telephone Encounter (Signed)
Pt would like to become new pt to dr Burnice Logan

## 2014-03-14 NOTE — Telephone Encounter (Signed)
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

## 2014-03-14 NOTE — Telephone Encounter (Signed)
ok 

## 2014-03-14 NOTE — Telephone Encounter (Signed)
Pt has been sch

## 2014-03-25 ENCOUNTER — Other Ambulatory Visit: Payer: BC Managed Care – PPO

## 2014-03-28 ENCOUNTER — Other Ambulatory Visit (INDEPENDENT_AMBULATORY_CARE_PROVIDER_SITE_OTHER): Payer: BC Managed Care – PPO

## 2014-03-28 DIAGNOSIS — Z0000 Encounter for general adult medical examination without abnormal findings: Secondary | ICD-10-CM

## 2014-03-28 LAB — BASIC METABOLIC PANEL
BUN: 13 mg/dL (ref 6–23)
CALCIUM: 9 mg/dL (ref 8.4–10.5)
CHLORIDE: 101 meq/L (ref 96–112)
CO2: 28 mEq/L (ref 19–32)
CREATININE: 0.7 mg/dL (ref 0.4–1.5)
GFR: 120.87 mL/min (ref >60.00)
Glucose, Bld: 82 mg/dL (ref 70–99)
Potassium: 3.7 mEq/L (ref 3.5–5.1)
Sodium: 137 mEq/L (ref 135–145)

## 2014-03-28 LAB — HEPATIC FUNCTION PANEL
ALT: 47 U/L (ref 0–53)
AST: 35 U/L (ref 0–37)
Albumin: 4 g/dL (ref 3.5–5.2)
Alkaline Phosphatase: 64 U/L (ref 39–117)
BILIRUBIN DIRECT: 0.1 mg/dL (ref 0.0–0.3)
TOTAL PROTEIN: 7.2 g/dL (ref 6.0–8.3)
Total Bilirubin: 0.8 mg/dL (ref 0.2–1.2)

## 2014-03-28 LAB — CBC WITH DIFFERENTIAL/PLATELET
BASOS PCT: 1 % (ref 0.0–3.0)
Basophils Absolute: 0.1 10*3/uL (ref 0.0–0.1)
EOS ABS: 0.1 10*3/uL (ref 0.0–0.7)
Eosinophils Relative: 2.5 % (ref 0.0–5.0)
HEMATOCRIT: 43.9 % (ref 39.0–52.0)
HEMOGLOBIN: 15.2 g/dL (ref 13.0–17.0)
LYMPHS ABS: 1.2 10*3/uL (ref 0.7–4.0)
LYMPHS PCT: 21 % (ref 12.0–46.0)
MCHC: 34.7 g/dL (ref 30.0–36.0)
MCV: 91.3 fl (ref 78.0–100.0)
MONO ABS: 0.4 10*3/uL (ref 0.1–1.0)
Monocytes Relative: 8 % (ref 3.0–12.0)
NEUTROS ABS: 3.8 10*3/uL (ref 1.4–7.7)
Neutrophils Relative %: 67.5 % (ref 43.0–77.0)
Platelets: 163 10*3/uL (ref 150.0–400.0)
RBC: 4.81 Mil/uL (ref 4.22–5.81)
RDW: 12.7 % (ref 11.5–15.5)
WBC: 5.6 10*3/uL (ref 4.0–10.5)

## 2014-03-28 LAB — LIPID PANEL
CHOL/HDL RATIO: 5
Cholesterol: 250 mg/dL — ABNORMAL HIGH (ref 0–200)
HDL: 50.7 mg/dL (ref >39.00)
LDL CALC: 172 mg/dL — AB (ref 0–99)
NONHDL: 199.3
TRIGLYCERIDES: 135 mg/dL (ref 0.0–149.0)
VLDL: 27 mg/dL (ref 0.0–40.0)

## 2014-03-28 LAB — POCT URINALYSIS DIPSTICK
Bilirubin, UA: NEGATIVE
Blood, UA: NEGATIVE
Glucose, UA: NEGATIVE
Ketones, UA: NEGATIVE
LEUKOCYTES UA: NEGATIVE
Nitrite, UA: NEGATIVE
Protein, UA: NEGATIVE
Spec Grav, UA: <=1.005
UROBILINOGEN UA: 0.2
pH, UA: 5.5

## 2014-03-28 LAB — TSH: TSH: 0.98 u[IU]/mL (ref 0.35–4.50)

## 2014-03-31 ENCOUNTER — Encounter: Payer: BC Managed Care – PPO | Admitting: Internal Medicine

## 2014-04-01 ENCOUNTER — Ambulatory Visit: Payer: BC Managed Care – PPO | Admitting: Family Medicine

## 2014-04-04 ENCOUNTER — Ambulatory Visit (INDEPENDENT_AMBULATORY_CARE_PROVIDER_SITE_OTHER): Payer: BC Managed Care – PPO | Admitting: Internal Medicine

## 2014-04-04 ENCOUNTER — Encounter: Payer: Self-pay | Admitting: Internal Medicine

## 2014-04-04 VITALS — BP 158/100 | HR 86 | Temp 98.7°F | Resp 20 | Ht 65.25 in | Wt 182.0 lb

## 2014-04-04 DIAGNOSIS — Z0000 Encounter for general adult medical examination without abnormal findings: Secondary | ICD-10-CM

## 2014-04-04 DIAGNOSIS — E785 Hyperlipidemia, unspecified: Secondary | ICD-10-CM

## 2014-04-04 MED ORDER — ATORVASTATIN CALCIUM 20 MG PO TABS: 20.0000 mg | ORAL_TABLET | Freq: Every day | ORAL | Status: DC

## 2014-04-04 NOTE — Progress Notes (Signed)
Pre visit review using our clinic review tool, if applicable. No additional management support is needed unless otherwise documented below in the visit note. 

## 2014-04-04 NOTE — Patient Instructions (Signed)
Limit your sodium (Salt) intake  Please check your blood pressure on a regular basis.  If it is consistently greater than 150/90, please make an office appointment.    It is important that you exercise regularly, at least 20 minutes 3 to 4 times per week.  If you develop chest pain or shortness of breath seek  medical attention.  DASH Eating Plan DASH stands for "Dietary Approaches to Stop Hypertension." The DASH eating plan is a healthy eating plan that has been shown to reduce high blood pressure (hypertension). Additional health benefits may include reducing the risk of type 2 diabetes mellitus, heart disease, and stroke. The DASH eating plan may also help with weight loss. WHAT DO I NEED TO KNOW ABOUT THE DASH EATING PLAN? For the DASH eating plan, you will follow these general guidelines:  Choose foods with a percent daily value for sodium of less than 5% (as listed on the food label).  Use salt-free seasonings or herbs instead of table salt or sea salt.  Check with your health care provider or pharmacist before using salt substitutes.  Eat lower-sodium products, often labeled as "lower sodium" or "no salt added."  Eat fresh foods.  Eat more vegetables, fruits, and low-fat dairy products.  Choose whole grains. Look for the word "whole" as the first word in the ingredient list.  Choose fish and skinless chicken or [place] more often than red meat. Limit fish, poultry, and meat to 6 oz (170 g) each day.  Limit sweets, desserts, sugars, and sugary drinks.  Choose heart-healthy fats.  Limit cheese to 1 oz (28 g) per day.  Eat more home-cooked food and less restaurant, buffet, and fast food.  Limit fried foods.  Cook foods using methods other than frying.  Limit canned vegetables. If you do use them, rinse them well to decrease the sodium.  When eating at a restaurant, ask that your food be prepared with less salt, or no salt if possible. WHAT FOODS CAN I EAT? Seek help from  a dietitian for individual calorie needs. Grains Whole grain or whole wheat bread. Brown rice. Whole grain or whole wheat pasta. Quinoa, bulgur, and whole grain cereals. Low-sodium cereals. Corn or whole wheat flour tortillas. Whole grain cornbread. Whole grain crackers. Low-sodium crackers. Vegetables Fresh or frozen vegetables (raw, steamed, roasted, or grilled). Low-sodium or reduced-sodium tomato and vegetable juices. Low-sodium or reduced-sodium tomato sauce and paste. Low-sodium or reduced-sodium canned vegetables.  Fruits All fresh, canned (in natural juice), or frozen fruits. Meat and Other Protein Products Ground beef (85% or leaner), grass-fed beef, or beef trimmed of fat. Skinless chicken or [place]. Ground chicken or [place]. Pork trimmed of fat. All fish and seafood. Eggs. Dried beans, peas, or lentils. Unsalted nuts and seeds. Unsalted canned beans. Dairy Low-fat dairy products, such as skim or 1% milk, 2% or reduced-fat cheeses, low-fat ricotta or cottage cheese, or plain low-fat yogurt. Low-sodium or reduced-sodium cheeses. Fats and Oils Tub margarines without trans fats. Light or reduced-fat mayonnaise and salad dressings (reduced sodium). Avocado. Safflower, olive, or canola oils. Natural peanut or almond butter. Other Unsalted popcorn and pretzels. The items listed above may not be a complete list of recommended foods or beverages. Contact your dietitian for more options. WHAT FOODS ARE NOT RECOMMENDED? Grains White bread. White pasta. White rice. Refined cornbread. Bagels and croissants. Crackers that contain trans fat. Vegetables Creamed or fried vegetables. Vegetables in a cheese sauce. Regular canned vegetables. Regular canned tomato sauce and paste. Regular tomato  and vegetable juices. Fruits Dried fruits. Canned fruit in light or heavy syrup. Fruit juice. Meat and Other Protein Products Fatty cuts of meat. Ribs, chicken wings, bacon, sausage, bologna, salami,  chitterlings, fatback, hot dogs, bratwurst, and packaged luncheon meats. Salted nuts and seeds. Canned beans with salt. Dairy Whole or 2% milk, cream, half-and-half, and cream cheese. Whole-fat or sweetened yogurt. Full-fat cheeses or blue cheese. Nondairy creamers and whipped toppings. Processed cheese, cheese spreads, or cheese curds. Condiments Onion and garlic salt, seasoned salt, table salt, and sea salt. Canned and packaged gravies. Worcestershire sauce. Tartar sauce. Barbecue sauce. Teriyaki sauce. Soy sauce, including reduced sodium. Steak sauce. Fish sauce. Oyster sauce. Cocktail sauce. Horseradish. Ketchup and mustard. Meat flavorings and tenderizers. Bouillon cubes. Hot sauce. Tabasco sauce. Marinades. Taco seasonings. Relishes. Fats and Oils Butter, stick margarine, lard, shortening, ghee, and bacon fat. Coconut, palm kernel, or palm oils. Regular salad dressings. Other Pickles and olives. Salted popcorn and pretzels. The items listed above may not be a complete list of foods and beverages to avoid. Contact your dietitian for more information. WHERE CAN I FIND MORE INFORMATION? National Heart, Lung, and Blood Institute: travelstabloid.com Document Released: 07/14/2011 Document Revised: 12/09/2013 Document Reviewed: 05/29/2013 [facility] Patient Information 2015 [place], [place]. This information is not intended to replace advice given to you by your health care provider. Make sure you discuss any questions you have with your health care provider. Health Maintenance A healthy lifestyle and preventative care can promote health and wellness.  Maintain regular health, dental, and eye exams.  Eat a healthy diet. Foods like vegetables, fruits, whole grains, low-fat dairy products, and lean protein foods contain the nutrients you need and are low in calories. Decrease your intake of foods high in solid fats, added sugars, and salt. Get information about a proper  diet from your health care provider, if necessary.  Regular physical exercise is one of the most important things you can do for your health. Most adults should get at least 150 minutes of moderate-intensity exercise (any activity that increases your heart rate and causes you to sweat) each week. In addition, most adults need muscle-strengthening exercises on 2 or more days a week.   Maintain a healthy weight. The body mass index (BMI) is a screening tool to identify possible weight problems. It provides an estimate of body fat based on height and weight. Your health care provider can find your BMI and can help you achieve or maintain a healthy weight. For males 20 years and older:  A BMI below 18.5 is considered underweight.  A BMI of 18.5 to 24.9 is normal.  A BMI of 25 to 29.9 is considered overweight.  A BMI of 30 and above is considered obese.  Maintain normal blood lipids and cholesterol by exercising and minimizing your intake of saturated fat. Eat a balanced diet with plenty of fruits and vegetables. Blood tests for lipids and cholesterol should begin at age 11 and be repeated every 5 years. If your lipid or cholesterol levels are high, you are over age 10, or you are at high risk for heart disease, you may need your cholesterol levels checked more frequently.Ongoing high lipid and cholesterol levels should be treated with medicines if diet and exercise are not working.  If you smoke, find out from your health care provider how to quit. If you do not use tobacco, do not start.  Lung cancer screening is recommended for adults aged 88-80 years who are at high risk for developing  lung cancer because of a history of smoking. A yearly low-dose CT scan of the lungs is recommended for people who have at least a 30-pack-year history of smoking and are current smokers or have quit within the past 15 years. A pack year of smoking is smoking an average of 1 pack of cigarettes a day for 1 year (for  example, a 30-pack-year history of smoking could mean smoking 1 pack a day for 30 years or 2 packs a day for 15 years). Yearly screening should continue until the smoker has stopped smoking for at least 15 years. Yearly screening should be stopped for people who develop a health problem that would prevent them from having lung cancer treatment.  If you choose to drink alcohol, do not have more than 2 drinks per day. One drink is considered to be 12 oz (360 mL) of beer, 5 oz (150 mL) of wine, or 1.5 oz (45 mL) of liquor.  Avoid the use of street drugs. Do not share needles with anyone. Ask for help if you need support or instructions about stopping the use of drugs.  High blood pressure causes heart disease and increases the risk of stroke. Blood pressure should be checked at least every 1-2 years. Ongoing high blood pressure should be treated with medicines if weight loss and exercise are not effective.  If you are 27-57 years old, ask your health care provider if you should take aspirin to prevent heart disease.  Diabetes screening involves taking a blood sample to check your fasting blood sugar level. This should be done once every 3 years after age 66 if you are at a normal weight and without risk factors for diabetes. Testing should be considered at a younger age or be carried out more frequently if you are overweight and have at least 1 risk factor for diabetes.  Colorectal cancer can be detected and often prevented. Most routine colorectal cancer screening begins at the age of 52 and continues through age 56. However, your health care provider may recommend screening at an earlier age if you have risk factors for colon cancer. On a yearly basis, your health care provider may provide home test kits to check for hidden blood in the stool. A small camera at the end of a tube may be used to directly examine the colon (sigmoidoscopy or colonoscopy) to detect the earliest forms of colorectal cancer. Talk  to your health care provider about this at age 16 when routine screening begins. A direct exam of the colon should be repeated every 5-10 years through age 43, unless early forms of precancerous polyps or small growths are found.  People who are at an increased risk for hepatitis B should be screened for this virus. You are considered at high risk for hepatitis B if:  You were born in a country where hepatitis B occurs often. Talk with your health care provider about which countries are considered high risk.  Your parents were born in a high-risk country and you have not received a shot to protect against hepatitis B (hepatitis B vaccine).  You have HIV or AIDS.  You use needles to inject street drugs.  You live with, or have sex with, someone who has hepatitis B.  You are a man who has sex with other men (MSM).  You get hemodialysis treatment.  You take certain medicines for conditions like cancer, organ transplantation, and autoimmune conditions.  Hepatitis C blood testing is recommended for all people born from  1945 through 1965 and any individual with known risk factors for hepatitis C.  Healthy men should no longer receive prostate-specific antigen (PSA) blood tests as part of routine cancer screening. Talk to your health care provider about prostate cancer screening.  Testicular cancer screening is not recommended for adolescents or adult males who have no symptoms. Screening includes self-exam, a health care provider exam, and other screening tests. Consult with your health care provider about any symptoms you have or any concerns you have about testicular cancer.  Practice safe sex. Use condoms and avoid high-risk sexual practices to reduce the spread of sexually transmitted infections (STIs).  You should be screened for STIs, including gonorrhea and chlamydia if:  You are sexually active and are younger than 24 years.  You are older than 24 years, and your health care  provider tells you that you are at risk for this type of infection.  Your sexual activity has changed since you were last screened, and you are at an increased risk for chlamydia or gonorrhea. Ask your health care provider if you are at risk.  If you are at risk of being infected with HIV, it is recommended that you take a prescription medicine daily to prevent HIV infection. This is called pre-exposure prophylaxis (PrEP). You are considered at risk if:  You are a man who has sex with other men (MSM).  You are a heterosexual man who is sexually active with multiple partners.  You take drugs by injection.  You are sexually active with a partner who has HIV.  Talk with your health care provider about whether you are at high risk of being infected with HIV. If you choose to begin PrEP, you should first be tested for HIV. You should then be tested every 3 months for as long as you are taking PrEP.  Use sunscreen. Apply sunscreen liberally and repeatedly throughout the day. You should seek shade when your shadow is shorter than you. Protect yourself by wearing long sleeves, pants, a wide-brimmed hat, and sunglasses year round whenever you are outdoors.  Tell your health care provider of new moles or changes in moles, especially if there is a change in shape or color. Also, tell your health care provider if a mole is larger than the size of a pencil eraser.  A one-time screening for abdominal aortic aneurysm (AAA) and surgical repair of large AAAs by ultrasound is recommended for men aged 81-75 years who are current or former smokers.  Stay current with your vaccines (immunizations). Document Released: 01/21/2008 Document Revised: 07/30/2013 Document Reviewed: 12/20/2010 [facility] Patient Information 2015 [place], [place]. This information is not intended to replace advice given to you by your health care provider. Make sure you discuss any questions you have with your health care provider.

## 2014-04-04 NOTE — Progress Notes (Signed)
Subjective:    Patient ID: Preston Williams, male    DOB: [birth year], 48 y.o.   MRN: [id number]  HPI  BP Readings from Last 3 Encounters:  04/04/14 158/100  03/27/13 140/90  09/19/12 3/64    48  year old patient who has a history of treated dyslipidemia.  He has been on low-dose Crestor 10 mg 3 times weekly.  He has more recently been off this medication.  Lipid profile reviewed No history of hypertension. General enjoys excellent health  Family history noncontributory.  Both parents age 79 father with coronary artery disease status post angioplasty.  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 Hypertension   . Arthritis   . Hyperlipidemia     History   Social History  . Marital Status: Married    Spouse Name: N/A    Number of Children: N/A  . Years of Education: N/A   Occupational History  . Not on file.   Social History Main Topics  . Smoking status: Never Smoker   . Smokeless tobacco: Never Used  . Alcohol Use: 1.8 oz/week    3 Glasses of wine per week  . Drug Use: No  . Sexual Activity: Yes   Other Topics Concern  . Not on file   Social History Narrative  . No narrative on file    Past Surgical History  Procedure Laterality Date  . Fx of wrist      Family History  Problem Relation Age of Onset  . Stroke Mother   . Hypertension Mother   . Heart disease Father     cabg    No Known Allergies  Current Outpatient Prescriptions on File Prior to Visit  Medication Sig Dispense Refill  . rosuvastatin (CRESTOR) 10 MG tablet Take 1 tablet (10 mg total) by mouth 3 (three) times a week. 1 3 times a week       No current facility-administered medications on file prior to visit.    BP 158/100  Pulse 86  Temp(Src) 98.7 F (37.1 C) (Oral)  Resp 20  Ht 5' 5.25" (1.657 m)  Wt 182 lb (82.555 kg)  BMI  30.07 kg/m2  SpO2 98%      Review of Systems  Constitutional: Negative for fever, chills, activity change, appetite change and fatigue.  HENT: Negative for congestion, dental problem, ear pain, hearing loss, mouth sores, rhinorrhea, sinus pressure, sneezing, tinnitus, trouble swallowing and voice change.   Eyes: Negative for photophobia, pain, redness and visual disturbance.  Respiratory: Negative for apnea, cough, choking, chest tightness, shortness of breath and wheezing.   Cardiovascular: Negative for chest pain, palpitations and leg swelling.  Gastrointestinal: Negative for nausea, vomiting, abdominal pain, diarrhea, constipation, blood in stool, abdominal distention, anal bleeding and rectal pain.  Genitourinary: Negative for dysuria, urgency, frequency, hematuria, flank pain, decreased urine volume, discharge, penile swelling, scrotal swelling, difficulty urinating, genital sores and testicular pain.  Musculoskeletal: Negative for arthralgias, back pain, gait problem, joint swelling, myalgias, neck pain and neck stiffness.  Skin: Negative for color change, rash and wound.  Neurological: Negative for dizziness, tremors, seizures, syncope, facial asymmetry, speech difficulty, weakness, light-headedness, numbness and headaches.  Hematological: Negative for adenopathy. Does not bruise/bleed easily.  Psychiatric/Behavioral: Negative for suicidal ideas, hallucinations, behavioral problems, confusion, sleep disturbance, self-injury, dysphoric mood, decreased concentration and agitation. The patient is not  nervous/anxious.        Objective:   Physical Exam  Constitutional: He appears well-developed and well-nourished.  Blood pressure 150/90  HENT:  Head: Normocephalic and atraumatic.  Right Ear: External ear normal.  Left Ear: External ear normal.  Nose: Nose normal.  Mouth/Throat: Oropharynx is clear and moist.  Eyes: Conjunctivae and EOM are normal. Pupils are equal, round, and  reactive to light. No scleral icterus.  Neck: Normal range of motion. Neck supple. No JVD present. No thyromegaly present.  Cardiovascular: Regular rhythm, normal heart sounds and intact distal pulses.  Exam reveals no gallop and no friction rub.   No murmur heard. Pulmonary/Chest: Effort normal and breath sounds normal. He exhibits no tenderness.  Abdominal: Soft. Bowel sounds are normal. He exhibits no distension and no mass. There is no tenderness.  Genitourinary: Prostate normal and penis normal.  Musculoskeletal: Normal range of motion. He exhibits no edema and no tenderness.  Lymphadenopathy:    He has no cervical adenopathy.  Neurological: He is alert. He has normal reflexes. No cranial nerve deficit. Coordination normal.  Skin: Skin is warm and dry. No rash noted.  Psychiatric: He has a normal mood and affect. His behavior is normal.          Assessment & Plan:   Preventive health examination  Blood pressure, borderline high today.  Patient has already started an exercise program.  Lifestyle issues addressed.  Home blood pressure monitoring.  Will be initiated  Dyslipidemia.  Risk and benefits of statin therapy discussed.  Will start atorvastatin 20  Continue home blood pressure monitoring We checked one year

## 2015-03-30 ENCOUNTER — Other Ambulatory Visit (INDEPENDENT_AMBULATORY_CARE_PROVIDER_SITE_OTHER): Payer: BLUE CROSS/BLUE SHIELD

## 2015-03-30 DIAGNOSIS — Z0000 Encounter for general adult medical examination without abnormal findings: Secondary | ICD-10-CM | POA: Diagnosis not present

## 2015-03-30 LAB — POCT URINALYSIS DIPSTICK
BILIRUBIN UA: NEGATIVE
GLUCOSE UA: NEGATIVE
KETONES UA: NEGATIVE
Leukocytes, UA: NEGATIVE
NITRITE UA: NEGATIVE
Protein, UA: NEGATIVE
RBC UA: NEGATIVE
Spec Grav, UA: 1.025
Urobilinogen, UA: 0.2
pH, UA: 6

## 2015-03-30 LAB — HEPATIC FUNCTION PANEL
ALBUMIN: 4.3 g/dL (ref 3.5–5.2)
ALK PHOS: 70 U/L (ref 39–117)
ALT: 30 U/L (ref 0–53)
AST: 24 U/L (ref 0–37)
Bilirubin, Direct: 0.1 mg/dL (ref 0.0–0.3)
TOTAL PROTEIN: 7.1 g/dL (ref 6.0–8.3)
Total Bilirubin: 0.7 mg/dL (ref 0.2–1.2)

## 2015-03-30 LAB — CBC WITH DIFFERENTIAL/PLATELET
BASOS ABS: 0 10*3/uL (ref 0.0–0.1)
Basophils Relative: 0.5 % (ref 0.0–3.0)
Eosinophils Absolute: 0.2 10*3/uL (ref 0.0–0.7)
Eosinophils Relative: 2.9 % (ref 0.0–5.0)
HCT: 44 % (ref 39.0–52.0)
HEMOGLOBIN: 15.2 g/dL (ref 13.0–17.0)
Lymphocytes Relative: 24.5 % (ref 12.0–46.0)
Lymphs Abs: 1.4 10*3/uL (ref 0.7–4.0)
MCHC: 34.6 g/dL (ref 30.0–36.0)
MCV: 92.4 fl (ref 78.0–100.0)
MONOS PCT: 10.1 % (ref 3.0–12.0)
Monocytes Absolute: 0.6 10*3/uL (ref 0.1–1.0)
NEUTROS PCT: 62 % (ref 43.0–77.0)
Neutro Abs: 3.6 10*3/uL (ref 1.4–7.7)
Platelets: 200 10*3/uL (ref 150.0–400.0)
RBC: 4.76 Mil/uL (ref 4.22–5.81)
RDW: 13.1 % (ref 11.5–15.5)
WBC: 5.8 10*3/uL (ref 4.0–10.5)

## 2015-03-30 LAB — BASIC METABOLIC PANEL
BUN: 17 mg/dL (ref 6–23)
CALCIUM: 9.1 mg/dL (ref 8.4–10.5)
CO2: 29 mEq/L (ref 19–32)
Chloride: 100 mEq/L (ref 96–112)
Creatinine, Ser: 0.79 mg/dL (ref 0.40–1.50)
GFR: 111.6 mL/min (ref >60.00)
GLUCOSE: 96 mg/dL (ref 70–99)
POTASSIUM: 4.5 meq/L (ref 3.5–5.1)
SODIUM: 137 meq/L (ref 135–145)

## 2015-03-30 LAB — LIPID PANEL
CHOL/HDL RATIO: 4
Cholesterol: 211 mg/dL — ABNORMAL HIGH (ref 0–200)
HDL: 48.5 mg/dL (ref >39.00)
LDL CALC: 134 mg/dL — AB (ref 0–99)
NonHDL: 162.33
TRIGLYCERIDES: 143 mg/dL (ref 0.0–149.0)
VLDL: 28.6 mg/dL (ref 0.0–40.0)

## 2015-03-30 LAB — TSH: TSH: 0.66 u[IU]/mL (ref 0.35–4.50)

## 2015-04-06 ENCOUNTER — Ambulatory Visit (INDEPENDENT_AMBULATORY_CARE_PROVIDER_SITE_OTHER): Payer: BLUE CROSS/BLUE SHIELD | Admitting: Internal Medicine

## 2015-04-06 DIAGNOSIS — M159 Polyosteoarthritis, unspecified: Secondary | ICD-10-CM

## 2015-04-06 DIAGNOSIS — Z0000 Encounter for general adult medical examination without abnormal findings: Secondary | ICD-10-CM | POA: Diagnosis not present

## 2015-04-06 DIAGNOSIS — M150 Primary generalized (osteo)arthritis: Secondary | ICD-10-CM | POA: Diagnosis not present

## 2015-04-06 DIAGNOSIS — E785 Hyperlipidemia, unspecified: Secondary | ICD-10-CM

## 2015-04-06 NOTE — Progress Notes (Signed)
Subjective:    Patient ID: Preston Williams, male    DOB: 03/17/1966, 49 y.o.   MRN: [id number]  HPI   BP Readings from Last 3 Encounters:  04/04/14 158/100  03/27/13 140/90  09/19/12 19/54    49  year old patient who is seen today for a preventive health examination; has a history of treated dyslipidemia.  He has been on low-dose Crestor 10 mg 3 times weekly in the past and one year ago was placed on atorvastatin 20 mg daily.  He has more recently been off this medication for 2 weeks.  Lipid profile reviewed and revealed a total cholesterol of only 211 No history of hypertension. General enjoys excellent health  BP Readings from Last 3 Encounters:  04/04/14 158/100  03/27/13 140/90  09/19/12 146/90    Family history noncontributory.  Both parents age 66 father with coronary artery disease status post angioplasty.  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 Hypertension   . Arthritis   . Hyperlipidemia     Social History   Social History  . Marital Status: Married    Spouse Name: N/A  . Number of Children: N/A  . Years of Education: N/A   Occupational History  . Not on file.   Social History Main Topics  . Smoking status: Never Smoker   . Smokeless tobacco: Never Used  . Alcohol Use: 1.8 oz/week    3 Glasses of wine per week  . Drug Use: No  . Sexual Activity: Yes   Other Topics Concern  . Not on file   Social History Narrative  . No narrative on file    Past Surgical History  Procedure Laterality Date  . Fx of wrist      Family History  Problem Relation Age of Onset  . Stroke Mother   . Hypertension Mother   . Heart disease Father     cabg    No Known Allergies  Current Outpatient Prescriptions on File Prior to Visit  Medication Sig Dispense Refill  . atorvastatin (LIPITOR) 20 MG tablet  Take 1 tablet (20 mg total) by mouth daily. 90 tablet 3   No current facility-administered medications on file prior to visit.    There were no vitals taken for this visit.      Review of Systems  Constitutional: Negative for fever, chills, activity change, appetite change and fatigue.  HENT: Negative for congestion, dental problem, ear pain, hearing loss, mouth sores, rhinorrhea, sinus pressure, sneezing, tinnitus, trouble swallowing and voice change.   Eyes: Negative for photophobia, pain, redness and visual disturbance.  Respiratory: Negative for apnea, cough, choking, chest tightness, shortness of breath and wheezing.   Cardiovascular: Negative for chest pain, palpitations and leg swelling.  Gastrointestinal: Negative for nausea, vomiting, abdominal pain, diarrhea, constipation, blood in stool, abdominal distention, anal bleeding and rectal pain.  Genitourinary: Negative for dysuria, urgency, frequency, hematuria, flank pain, decreased urine volume, discharge, penile swelling, scrotal swelling, difficulty urinating, genital sores and testicular pain.  Musculoskeletal: Negative for myalgias, back pain, joint swelling, arthralgias, gait problem, neck pain and neck stiffness.  Skin: Negative for color change, rash and wound.  Neurological: Negative for dizziness, tremors, seizures, syncope, facial asymmetry, speech difficulty, weakness, light-headedness, numbness and headaches.  Hematological: Negative for adenopathy. Does not bruise/bleed easily.  Psychiatric/Behavioral: Negative for suicidal ideas, hallucinations,  behavioral problems, confusion, sleep disturbance, self-injury, dysphoric mood, decreased concentration and agitation. The patient is not nervous/anxious.        Objective:   Physical Exam  Constitutional: He appears well-developed and well-nourished.  Blood pressure 150/90  HENT:  Head: Normocephalic and atraumatic.  Right Ear: External ear normal.  Left Ear: External  ear normal.  Nose: Nose normal.  Mouth/Throat: Oropharynx is clear and moist.  Eyes: Conjunctivae and EOM are normal. Pupils are equal, round, and reactive to light. No scleral icterus.  Neck: Normal range of motion. Neck supple. No JVD present. No thyromegaly present.  Cardiovascular: Regular rhythm, normal heart sounds and intact distal pulses.  Exam reveals no gallop and no friction rub.   No murmur heard. Pulmonary/Chest: Effort normal and breath sounds normal. He exhibits no tenderness.  Abdominal: Soft. Bowel sounds are normal. He exhibits no distension and no mass. There is no tenderness.  Genitourinary: Prostate normal and penis normal.  Musculoskeletal: Normal range of motion. He exhibits no edema or tenderness.  Lymphadenopathy:    He has no cervical adenopathy.  Neurological: He is alert. He has normal reflexes. No cranial nerve deficit. Coordination normal.  Skin: Skin is warm and dry. No rash noted.  Psychiatric: He has a normal mood and affect. His behavior is normal.          Assessment & Plan:   Preventive health examination  Blood pressure elevated today.  Will place on a DASH diet and monitor closely.  Will reassess in 4 weeks  Dyslipidemia.  Risk and benefits of statin therapy discussed.  Lipid profile much improved with nonpharmacologic management.  Will discontinue statin therapy and continue to observe  Continue home blood pressure monitoring

## 2015-04-06 NOTE — Patient Instructions (Signed)
Limit your sodium (Salt) intake  Please check your blood pressure on a regular basis.  If it is consistently greater than 150/90, please make an office appointment.  Health Maintenance A healthy lifestyle and preventative care can promote health and wellness.  Maintain regular health, dental, and eye exams.  Eat a healthy diet. Foods like vegetables, fruits, whole grains, low-fat dairy products, and lean protein foods contain the nutrients you need and are low in calories. Decrease your intake of foods high in solid fats, added sugars, and salt. Get information about a proper diet from your health care provider, if necessary.  Regular physical exercise is one of the most important things you can do for your health. Most adults should get at least 150 minutes of moderate-intensity exercise (any activity that increases your heart rate and causes you to sweat) each week. In addition, most adults need muscle-strengthening exercises on 2 or more days a week.   Maintain a healthy weight. The body mass index (BMI) is a screening tool to identify possible weight problems. It provides an estimate of body fat based on height and weight. Your health care provider can find your BMI and can help you achieve or maintain a healthy weight. For males 20 years and older:  A BMI below 18.5 is considered underweight.  A BMI of 18.5 to 24.9 is normal.  A BMI of 25 to 29.9 is considered overweight.  A BMI of 30 and above is considered obese.  Maintain normal blood lipids and cholesterol by exercising and minimizing your intake of saturated fat. Eat a balanced diet with plenty of fruits and vegetables. Blood tests for lipids and cholesterol should begin at age 29 and be repeated every 5 years. If your lipid or cholesterol levels are high, you are over age 64, or you are at high risk for heart disease, you may need your cholesterol levels checked more frequently.Ongoing high lipid and cholesterol levels should be  treated with medicines if diet and exercise are not working.  If you smoke, find out from your health care provider how to quit. If you do not use tobacco, do not start.  Lung cancer screening is recommended for adults aged 21-80 years who are at high risk for developing lung cancer because of a history of smoking. A yearly low-dose CT scan of the lungs is recommended for people who have at least a 30-pack-year history of smoking and are current smokers or have quit within the past 15 years. A pack year of smoking is smoking an average of 1 pack of cigarettes a day for 1 year (for example, a 30-pack-year history of smoking could mean smoking 1 pack a day for 30 years or 2 packs a day for 15 years). Yearly screening should continue until the smoker has stopped smoking for at least 15 years. Yearly screening should be stopped for people who develop a health problem that would prevent them from having lung cancer treatment.  If you choose to drink alcohol, do not have more than 2 drinks per day. One drink is considered to be 12 oz (360 mL) of beer, 5 oz (150 mL) of wine, or 1.5 oz (45 mL) of liquor.  Avoid the use of street drugs. Do not share needles with anyone. Ask for help if you need support or instructions about stopping the use of drugs.  High blood pressure causes heart disease and increases the risk of stroke. Blood pressure should be checked at least every 1-2 years. Ongoing  high blood pressure should be treated with medicines if weight loss and exercise are not effective.  If you are 28-27 years old, ask your health care provider if you should take aspirin to prevent heart disease.  Diabetes screening involves taking a blood sample to check your fasting blood sugar level. This should be done once every 3 years after age 6 if you are at a normal weight and without risk factors for diabetes. Testing should be considered at a younger age or be carried out more frequently if you are overweight and  have at least 1 risk factor for diabetes.  Colorectal cancer can be detected and often prevented. Most routine colorectal cancer screening begins at the age of 69 and continues through age 14. However, your health care provider may recommend screening at an earlier age if you have risk factors for colon cancer. On a yearly basis, your health care provider may provide home test kits to check for hidden blood in the stool. A small camera at the end of a tube may be used to directly examine the colon (sigmoidoscopy or colonoscopy) to detect the earliest forms of colorectal cancer. Talk to your health care provider about this at age 51 when routine screening begins. A direct exam of the colon should be repeated every 5-10 years through age 54, unless early forms of precancerous polyps or small growths are found.  People who are at an increased risk for hepatitis B should be screened for this virus. You are considered at high risk for hepatitis B if:  You were born in a country where hepatitis B occurs often. Talk with your health care provider about which countries are considered high risk.  Your parents were born in a high-risk country and you have not received a shot to protect against hepatitis B (hepatitis B vaccine).  You have HIV or AIDS.  You use needles to inject street drugs.  You live with, or have sex with, someone who has hepatitis B.  You are a man who has sex with other men (MSM).  You get hemodialysis treatment.  You take certain medicines for conditions like cancer, organ transplantation, and autoimmune conditions.  Hepatitis C blood testing is recommended for all people born from [birth year] through 1963 and any individual with known risk factors for hepatitis C.  Healthy men should no longer receive prostate-specific antigen (PSA) blood tests as part of routine cancer screening. Talk to your health care provider about prostate cancer screening.  Testicular cancer screening is not  recommended for adolescents or adult males who have no symptoms. Screening includes self-exam, a health care provider exam, and other screening tests. Consult with your health care provider about any symptoms you have or any concerns you have about testicular cancer.  Practice safe sex. Use condoms and avoid high-risk sexual practices to reduce the spread of sexually transmitted infections (STIs).  You should be screened for STIs, including gonorrhea and chlamydia if:  You are sexually active and are younger than 24 years.  You are older than 24 years, and your health care provider tells you that you are at risk for this type of infection.  Your sexual activity has changed since you were last screened, and you are at an increased risk for chlamydia or gonorrhea. Ask your health care provider if you are at risk.  If you are at risk of being infected with HIV, it is recommended that you take a prescription medicine daily to prevent HIV infection. This is  called pre-exposure prophylaxis (PrEP). You are considered at risk if:  You are a man who has sex with other men (MSM).  You are a heterosexual man who is sexually active with multiple partners.  You take drugs by injection.  You are sexually active with a partner who has HIV.  Talk with your health care provider about whether you are at high risk of being infected with HIV. If you choose to begin PrEP, you should first be tested for HIV. You should then be tested every 3 months for as long as you are taking PrEP.  Use sunscreen. Apply sunscreen liberally and repeatedly throughout the day. You should seek shade when your shadow is shorter than you. Protect yourself by wearing long sleeves, pants, a wide-brimmed hat, and sunglasses year round whenever you are outdoors.  Tell your health care provider of new moles or changes in moles, especially if there is a change in shape or color. Also, tell your health care provider if a mole is larger  than the size of a pencil eraser.  A one-time screening for abdominal aortic aneurysm (AAA) and surgical repair of large AAAs by ultrasound is recommended for men aged 45-75 years who are current or former smokers.  Stay current with your vaccines (immunizations). Document Released: 01/21/2008 Document Revised: 07/30/2013 Document Reviewed: 12/20/2010 [facility] Patient Information 2015 [place], [place]. This information is not intended to replace advice given to you by your health care provider. Make sure you discuss any questions you have with your health care provider. DASH Eating Plan DASH stands for "Dietary Approaches to Stop Hypertension." The DASH eating plan is a healthy eating plan that has been shown to reduce high blood pressure (hypertension). Additional health benefits may include reducing the risk of type 2 diabetes mellitus, heart disease, and stroke. The DASH eating plan may also help with weight loss. WHAT DO I NEED TO KNOW ABOUT THE DASH EATING PLAN? For the DASH eating plan, you will follow these general guidelines:  Choose foods with a percent daily value for sodium of less than 5% (as listed on the food label).  Use salt-free seasonings or herbs instead of table salt or sea salt.  Check with your health care provider or pharmacist before using salt substitutes.  Eat lower-sodium products, often labeled as "lower sodium" or "no salt added."  Eat fresh foods.  Eat more vegetables, fruits, and low-fat dairy products.  Choose whole grains. Look for the word "whole" as the first word in the ingredient list.  Choose fish and skinless chicken or [place] more often than red meat. Limit fish, poultry, and meat to 6 oz (170 g) each day.  Limit sweets, desserts, sugars, and sugary drinks.  Choose heart-healthy fats.  Limit cheese to 1 oz (28 g) per day.  Eat more home-cooked food and less restaurant, buffet, and fast food.  Limit fried foods.  Cook foods using methods  other than frying.  Limit canned vegetables. If you do use them, rinse them well to decrease the sodium.  When eating at a restaurant, ask that your food be prepared with less salt, or no salt if possible. WHAT FOODS CAN I EAT? Seek help from a dietitian for individual calorie needs. Grains Whole grain or whole wheat bread. Brown rice. Whole grain or whole wheat pasta. Quinoa, bulgur, and whole grain cereals. Low-sodium cereals. Corn or whole wheat flour tortillas. Whole grain cornbread. Whole grain crackers. Low-sodium crackers. Vegetables Fresh or frozen vegetables (raw, steamed, roasted, or grilled).  Low-sodium or reduced-sodium tomato and vegetable juices. Low-sodium or reduced-sodium tomato sauce and paste. Low-sodium or reduced-sodium canned vegetables.  Fruits All fresh, canned (in natural juice), or frozen fruits. Meat and Other Protein Products Ground beef (85% or leaner), grass-fed beef, or beef trimmed of fat. Skinless chicken or [place]. Ground chicken or [place]. Pork trimmed of fat. All fish and seafood. Eggs. Dried beans, peas, or lentils. Unsalted nuts and seeds. Unsalted canned beans. Dairy Low-fat dairy products, such as skim or 1% milk, 2% or reduced-fat cheeses, low-fat ricotta or cottage cheese, or plain low-fat yogurt. Low-sodium or reduced-sodium cheeses. Fats and Oils Tub margarines without trans fats. Light or reduced-fat mayonnaise and salad dressings (reduced sodium). Avocado. Safflower, olive, or canola oils. Natural peanut or almond butter. Other Unsalted popcorn and pretzels. The items listed above may not be a complete list of recommended foods or beverages. Contact your dietitian for more options. WHAT FOODS ARE NOT RECOMMENDED? Grains White bread. White pasta. White rice. Refined cornbread. Bagels and croissants. Crackers that contain trans fat. Vegetables Creamed or fried vegetables. Vegetables in a cheese sauce. Regular canned vegetables. Regular canned  tomato sauce and paste. Regular tomato and vegetable juices. Fruits Dried fruits. Canned fruit in light or heavy syrup. Fruit juice. Meat and Other Protein Products Fatty cuts of meat. Ribs, chicken wings, bacon, sausage, bologna, salami, chitterlings, fatback, hot dogs, bratwurst, and packaged luncheon meats. Salted nuts and seeds. Canned beans with salt. Dairy Whole or 2% milk, cream, half-and-half, and cream cheese. Whole-fat or sweetened yogurt. Full-fat cheeses or blue cheese. Nondairy creamers and whipped toppings. Processed cheese, cheese spreads, or cheese curds. Condiments Onion and garlic salt, seasoned salt, table salt, and sea salt. Canned and packaged gravies. Worcestershire sauce. Tartar sauce. Barbecue sauce. Teriyaki sauce. Soy sauce, including reduced sodium. Steak sauce. Fish sauce. Oyster sauce. Cocktail sauce. Horseradish. Ketchup and mustard. Meat flavorings and tenderizers. Bouillon cubes. Hot sauce. Tabasco sauce. Marinades. Taco seasonings. Relishes. Fats and Oils Butter, stick margarine, lard, shortening, ghee, and bacon fat. Coconut, palm kernel, or palm oils. Regular salad dressings. Other Pickles and olives. Salted popcorn and pretzels. The items listed above may not be a complete list of foods and beverages to avoid. Contact your dietitian for more information. WHERE CAN I FIND MORE INFORMATION? National Heart, Lung, and Blood Institute: travelstabloid.com Document Released: 07/14/2011 Document Revised: 12/09/2013 Document Reviewed: 05/29/2013 [facility] Patient Information 2015 [place], [place]. This information is not intended to replace advice given to you by your health care provider. Make sure you discuss any questions you have with your health care provider.

## 2015-04-26 ENCOUNTER — Other Ambulatory Visit: Payer: Self-pay | Admitting: Internal Medicine

## 2015-05-18 ENCOUNTER — Encounter: Payer: Self-pay | Admitting: Internal Medicine

## 2015-05-18 ENCOUNTER — Ambulatory Visit (INDEPENDENT_AMBULATORY_CARE_PROVIDER_SITE_OTHER): Payer: BLUE CROSS/BLUE SHIELD | Admitting: Internal Medicine

## 2015-05-18 VITALS — BP 140/98 | HR 68 | Temp 98.8°F | Resp 20 | Ht 65.25 in | Wt 190.0 lb

## 2015-05-18 DIAGNOSIS — Z23 Encounter for immunization: Secondary | ICD-10-CM | POA: Diagnosis not present

## 2015-05-18 DIAGNOSIS — E785 Hyperlipidemia, unspecified: Secondary | ICD-10-CM | POA: Diagnosis not present

## 2015-05-18 DIAGNOSIS — M150 Primary generalized (osteo)arthritis: Secondary | ICD-10-CM | POA: Diagnosis not present

## 2015-05-18 DIAGNOSIS — M159 Polyosteoarthritis, unspecified: Secondary | ICD-10-CM

## 2015-05-18 NOTE — Progress Notes (Signed)
   Subjective:    Patient ID: Preston Williams, male    DOB: Jan 18, 1966, 49 y.o.   MRN: [id number]  HPI  BP Readings from Last 3 Encounters:  05/18/15 140/98  04/04/14 158/100  03/27/13 63/80   49 year old patient who was seen recently for a physical with the borderline elevation of blood pressures.  He does have a home blood pressure monitor, which she tracks closely.  Blood pressure readings are usually in a high normal range and rarely greater than 140 over 90.  He feels well.  He was placed on a- DASH eating plan.  Last visit, which he has adhered to.  He is exercising regularly  Past Medical History  Diagnosis Date  . Hypertension   . Arthritis   . Hyperlipidemia     Social History   Social History  . Marital Status: Married    Spouse Name: N/A  . Number of Children: N/A  . Years of Education: N/A   Occupational History  . Not on file.   Social History Main Topics  . Smoking status: Never Smoker   . Smokeless tobacco: Never Used  . Alcohol Use: 1.8 oz/week    3 Glasses of wine per week  . Drug Use: No  . Sexual Activity: Yes   Other Topics Concern  . Not on file   Social History Narrative    Past Surgical History  Procedure Laterality Date  . Fx of wrist      Family History  Problem Relation Age of Onset  . Stroke Mother   . Hypertension Mother   . Heart disease Father     cabg    No Known Allergies  No current outpatient prescriptions on file prior to visit.   No current facility-administered medications on file prior to visit.    BP 140/98 mmHg  Pulse 68  Temp(Src) 98.8 F (37.1 C) (Oral)  Resp 20  Ht 5' 5.25" (1.657 m)  Wt 190 lb (86.183 kg)  BMI 31.39 kg/m2  SpO2 97%    Review of Systems  Constitutional: Negative for fever, chills, appetite change and fatigue.  HENT: Negative for congestion, dental problem, ear pain, hearing loss, sore throat, tinnitus, trouble swallowing and voice change.   Eyes: Negative for pain, discharge and  visual disturbance.  Respiratory: Negative for cough, chest tightness, wheezing and stridor.   Cardiovascular: Negative for chest pain, palpitations and leg swelling.  Gastrointestinal: Negative for nausea, vomiting, abdominal pain, diarrhea, constipation, blood in stool and abdominal distention.  Genitourinary: Negative for urgency, hematuria, flank pain, discharge, difficulty urinating and genital sores.  Musculoskeletal: Negative for myalgias, back pain, joint swelling, arthralgias, gait problem and neck stiffness.  Skin: Negative for rash.  Neurological: Negative for dizziness, syncope, speech difficulty, weakness, numbness and headaches.  Hematological: Negative for adenopathy. Does not bruise/bleed easily.  Psychiatric/Behavioral: Negative for behavioral problems and dysphoric mood. The patient is not nervous/anxious.        Objective:   Physical Exam  Constitutional: He appears well-developed and well-nourished. No distress.  Blood pressure 148/88          Assessment & Plan:   Hypertensive suspect.  Will continue DASH eating plan and regular exercise.  We'll continue close home blood pressure monitoring.  He will notify the office if blood pressure readings are consistently greater than 140 over 90.  Otherwise recheck in one year for his annual exam

## 2015-05-18 NOTE — Progress Notes (Signed)
Pre visit review using our clinic review tool, if applicable. No additional management support is needed unless otherwise documented below in the visit note. 

## 2015-05-18 NOTE — Patient Instructions (Signed)
Please check your blood pressure on a regular basis.  If it is consistently greater than 140/90, please make an office appointment.

## 2016-03-30 ENCOUNTER — Other Ambulatory Visit: Payer: BLUE CROSS/BLUE SHIELD

## 2016-04-01 ENCOUNTER — Other Ambulatory Visit (INDEPENDENT_AMBULATORY_CARE_PROVIDER_SITE_OTHER): Payer: BLUE CROSS/BLUE SHIELD

## 2016-04-01 DIAGNOSIS — Z0000 Encounter for general adult medical examination without abnormal findings: Secondary | ICD-10-CM | POA: Diagnosis not present

## 2016-04-01 LAB — LIPID PANEL
Cholesterol: 217 mg/dL — ABNORMAL HIGH (ref 0–200)
HDL: 46.9 mg/dL (ref >39.00)
LDL Cholesterol: 140 mg/dL — ABNORMAL HIGH (ref 0–99)
NonHDL: 170.09
TRIGLYCERIDES: 150 mg/dL — AB (ref 0.0–149.0)
Total CHOL/HDL Ratio: 5
VLDL: 30 mg/dL (ref 0.0–40.0)

## 2016-04-01 LAB — CBC WITH DIFFERENTIAL/PLATELET
BASOS ABS: 0 10*3/uL (ref 0.0–0.1)
BASOS PCT: 0.6 % (ref 0.0–3.0)
EOS ABS: 0.2 10*3/uL (ref 0.0–0.7)
EOS PCT: 3.1 % (ref 0.0–5.0)
HEMATOCRIT: 41.5 % (ref 39.0–52.0)
Hemoglobin: 14.5 g/dL (ref 13.0–17.0)
LYMPHS PCT: 26 % (ref 12.0–46.0)
Lymphs Abs: 1.5 10*3/uL (ref 0.7–4.0)
MCHC: 35 g/dL (ref 30.0–36.0)
MCV: 90.4 fl (ref 78.0–100.0)
MONOS PCT: 8 % (ref 3.0–12.0)
Monocytes Absolute: 0.5 10*3/uL (ref 0.1–1.0)
NEUTROS PCT: 62.3 % (ref 43.0–77.0)
Neutro Abs: 3.6 10*3/uL (ref 1.4–7.7)
PLATELETS: 192 10*3/uL (ref 150.0–400.0)
RBC: 4.59 Mil/uL (ref 4.22–5.81)
RDW: 12.3 % (ref 11.5–15.5)
WBC: 5.8 10*3/uL (ref 4.0–10.5)

## 2016-04-01 LAB — POC URINALSYSI DIPSTICK (AUTOMATED)
BILIRUBIN UA: NEGATIVE
Blood, UA: NEGATIVE
GLUCOSE UA: NEGATIVE
Ketones, UA: NEGATIVE
LEUKOCYTES UA: NEGATIVE
NITRITE UA: NEGATIVE
Protein, UA: NEGATIVE
Spec Grav, UA: 1.02
UROBILINOGEN UA: 0.2
pH, UA: 7

## 2016-04-01 LAB — TSH: TSH: 0.85 u[IU]/mL (ref 0.35–4.50)

## 2016-04-01 LAB — BASIC METABOLIC PANEL
BUN: 16 mg/dL (ref 6–23)
CHLORIDE: 103 meq/L (ref 96–112)
CO2: 28 mEq/L (ref 19–32)
Calcium: 8.7 mg/dL (ref 8.4–10.5)
Creatinine, Ser: 0.77 mg/dL (ref 0.40–1.50)
GFR: 114.46 mL/min (ref >60.00)
Glucose, Bld: 89 mg/dL (ref 70–99)
POTASSIUM: 4 meq/L (ref 3.5–5.1)
SODIUM: 137 meq/L (ref 135–145)

## 2016-04-01 LAB — HEPATIC FUNCTION PANEL
ALBUMIN: 4.1 g/dL (ref 3.5–5.2)
ALT: 31 U/L (ref 0–53)
AST: 24 U/L (ref 0–37)
Alkaline Phosphatase: 69 U/L (ref 39–117)
BILIRUBIN DIRECT: 0.1 mg/dL (ref 0.0–0.3)
TOTAL PROTEIN: 6.9 g/dL (ref 6.0–8.3)
Total Bilirubin: 0.5 mg/dL (ref 0.2–1.2)

## 2016-04-06 ENCOUNTER — Ambulatory Visit (INDEPENDENT_AMBULATORY_CARE_PROVIDER_SITE_OTHER): Payer: BLUE CROSS/BLUE SHIELD | Admitting: Internal Medicine

## 2016-04-06 ENCOUNTER — Encounter: Payer: Self-pay | Admitting: Internal Medicine

## 2016-04-06 VITALS — BP 170/110 | HR 74 | Temp 98.5°F | Resp 20 | Ht 65.25 in | Wt 188.0 lb

## 2016-04-06 DIAGNOSIS — I10 Essential (primary) hypertension: Secondary | ICD-10-CM | POA: Insufficient documentation

## 2016-04-06 DIAGNOSIS — Z0000 Encounter for general adult medical examination without abnormal findings: Secondary | ICD-10-CM | POA: Diagnosis not present

## 2016-04-06 MED ORDER — LISINOPRIL-HYDROCHLOROTHIAZIDE 20-25 MG PO TABS: 1.0000 | ORAL_TABLET | Freq: Every day | ORAL | 3 refills | Status: DC

## 2016-04-06 NOTE — Progress Notes (Signed)
Pre visit review using our clinic review tool, if applicable. No additional management support is needed unless otherwise documented below in the visit note. 

## 2016-04-06 NOTE — Progress Notes (Signed)
Subjective:    Patient ID: Preston Williams, male    DOB: January 24, 1966, 50 y.o.   MRN: [id number]  HPI  50 year old patient who is seen today for a preventive health examination  He has a history of borderline hypertension.  He has been managed nonpharmacologically and has been placed on a DASH diet.  He continues to monitor home blood pressure readings generally are high normal  Blood pressure today on arrival was 170 over 110.  Lowest blood pressure reading 160 over 100  Past Medical History:  Diagnosis Date  . Arthritis   . Hyperlipidemia   . Hypertension      Social History   Social History  . Marital status: Married    Spouse name: N/A  . Number of children: N/A  . Years of education: N/A   Occupational History  . Not on file.   Social History Main Topics  . Smoking status: Never Smoker  . Smokeless tobacco: Never Used  . Alcohol use 1.8 oz/week    3 Glasses of wine per week  . Drug use: No  . Sexual activity: Yes   Other Topics Concern  . Not on file   Social History Narrative  . No narrative on file    Past Surgical History:  Procedure Laterality Date  . fx of wrist      Family History  Problem Relation Age of Onset  . Stroke Mother   . Hypertension Mother   . Heart disease Father     cabg    No Known Allergies  No current outpatient prescriptions on file prior to visit.   No current facility-administered medications on file prior to visit.     BP (!) 170/110 (BP Location: Left Arm, Patient Position: Sitting, Cuff Size: Normal)   Pulse 74   Temp 98.5 F (36.9 C) (Oral)   Resp 20   Ht 5' 5.25" (1.657 m)   Wt 188 lb (85.3 kg)   SpO2 98%   BMI 31.05 kg/m     Review of Systems  Constitutional: Negative for activity change, appetite change, chills, fatigue and fever.  HENT: Negative for congestion, dental problem, ear pain, hearing loss, mouth sores, rhinorrhea, sinus pressure, sneezing, tinnitus, trouble swallowing and voice change.     Eyes: Negative for photophobia, pain, redness and visual disturbance.  Respiratory: Negative for apnea, cough, choking, chest tightness, shortness of breath and wheezing.   Cardiovascular: Negative for chest pain, palpitations and leg swelling.  Gastrointestinal: Negative for abdominal distention, abdominal pain, anal bleeding, blood in stool, constipation, diarrhea, nausea, rectal pain and vomiting.  Genitourinary: Negative for decreased urine volume, difficulty urinating, discharge, dysuria, flank pain, frequency, genital sores, hematuria, penile swelling, scrotal swelling, testicular pain and urgency.  Musculoskeletal: Negative for arthralgias, back pain, gait problem, joint swelling, myalgias, neck pain and neck stiffness.  Skin: Negative for color change, rash and wound.  Neurological: Negative for dizziness, tremors, seizures, syncope, facial asymmetry, speech difficulty, weakness, light-headedness, numbness and headaches.  Hematological: Negative for adenopathy. Does not bruise/bleed easily.  Psychiatric/Behavioral: Negative for agitation, behavioral problems, confusion, decreased concentration, dysphoric mood, hallucinations, self-injury, sleep disturbance and suicidal ideas. The patient is not nervous/anxious.        Objective:   Physical Exam  Constitutional: He appears well-developed and well-nourished.  Blood pressure 160/100  HENT:  Head: Normocephalic and atraumatic.  Right Ear: External ear normal.  Left Ear: External ear normal.  Nose: Nose normal.  Mouth/Throat: Oropharynx is clear and moist.  Eyes: Conjunctivae and EOM are normal. Pupils are equal, round, and reactive to light. No scleral icterus.  Neck: Normal range of motion. Neck supple. No JVD present. No thyromegaly present.  Cardiovascular: Regular rhythm, normal heart sounds and intact distal pulses.  Exam reveals no gallop and no friction rub.   No murmur heard. Pulmonary/Chest: Effort normal and breath sounds  normal. He exhibits no tenderness.  Abdominal: Soft. Bowel sounds are normal. He exhibits no distension and no mass. There is no tenderness.  Small ventral hernia above the umbilicus  Genitourinary: Prostate normal and penis normal. Rectal exam shows guaiac negative stool.  Musculoskeletal: Normal range of motion. He exhibits no edema or tenderness.  Lymphadenopathy:    He has no cervical adenopathy.  Neurological: He is alert. He has normal reflexes. No cranial nerve deficit. Coordination normal.  Skin: Skin is warm and dry. No rash noted.  Psychiatric: He has a normal mood and affect. His behavior is normal.          Assessment & Plan:   Preventive health examination Hypertension.  Will place on a combination lisinopril hydrochlorothiazide.  Continue home blood pressure monitoring, stricted  salt diet  Follow-up 6 weeks  Nyoka Cowden, MD

## 2016-04-06 NOTE — Patient Instructions (Addendum)
Limit your sodium (Salt) intake  Please check your blood pressure on a regular basis.  If it is consistently greater than 150/90, please make an office appointment.    DASH Eating Plan DASH stands for "Dietary Approaches to Stop Hypertension." The DASH eating plan is a healthy eating plan that has been shown to reduce high blood pressure (hypertension). Additional health benefits may include reducing the risk of type 2 diabetes mellitus, heart disease, and stroke. The DASH eating plan may also help with weight loss. WHAT DO I NEED TO KNOW ABOUT THE DASH EATING PLAN? For the DASH eating plan, you will follow these general guidelines:  Choose foods with a percent daily value for sodium of less than 5% (as listed on the food label).  Use salt-free seasonings or herbs instead of table salt or sea salt.  Check with your health care provider or pharmacist before using salt substitutes.  Eat lower-sodium products, often labeled as "lower sodium" or "no salt added."  Eat fresh foods.  Eat more vegetables, fruits, and low-fat dairy products.  Choose whole grains. Look for the word "whole" as the first word in the ingredient list.  Choose fish and skinless chicken or [place] more often than red meat. Limit fish, poultry, and meat to 6 oz (170 g) each day.  Limit sweets, desserts, sugars, and sugary drinks.  Choose heart-healthy fats.  Limit cheese to 1 oz (28 g) per day.  Eat more home-cooked food and less restaurant, buffet, and fast food.  Limit fried foods.  Cook foods using methods other than frying.  Limit canned vegetables. If you do use them, rinse them well to decrease the sodium.  When eating at a restaurant, ask that your food be prepared with less salt, or no salt if possible. WHAT FOODS CAN I EAT? Seek help from a dietitian for individual calorie needs. Grains Whole grain or whole wheat bread. Brown rice. Whole grain or whole wheat pasta. Quinoa, bulgur, and whole grain  cereals. Low-sodium cereals. Corn or whole wheat flour tortillas. Whole grain cornbread. Whole grain crackers. Low-sodium crackers. Vegetables Fresh or frozen vegetables (raw, steamed, roasted, or grilled). Low-sodium or reduced-sodium tomato and vegetable juices. Low-sodium or reduced-sodium tomato sauce and paste. Low-sodium or reduced-sodium canned vegetables.  Fruits All fresh, canned (in natural juice), or frozen fruits. Meat and Other Protein Products Ground beef (85% or leaner), grass-fed beef, or beef trimmed of fat. Skinless chicken or [place]. Ground chicken or [place]. Pork trimmed of fat. All fish and seafood. Eggs. Dried beans, peas, or lentils. Unsalted nuts and seeds. Unsalted canned beans. Dairy Low-fat dairy products, such as skim or 1% milk, 2% or reduced-fat cheeses, low-fat ricotta or cottage cheese, or plain low-fat yogurt. Low-sodium or reduced-sodium cheeses. Fats and Oils Tub margarines without trans fats. Light or reduced-fat mayonnaise and salad dressings (reduced sodium). Avocado. Safflower, olive, or canola oils. Natural peanut or almond butter. Other Unsalted popcorn and pretzels. The items listed above may not be a complete list of recommended foods or beverages. Contact your dietitian for more options. WHAT FOODS ARE NOT RECOMMENDED? Grains White bread. White pasta. White rice. Refined cornbread. Bagels and croissants. Crackers that contain trans fat. Vegetables Creamed or fried vegetables. Vegetables in a cheese sauce. Regular canned vegetables. Regular canned tomato sauce and paste. Regular tomato and vegetable juices. Fruits Dried fruits. Canned fruit in light or heavy syrup. Fruit juice. Meat and Other Protein Products Fatty cuts of meat. Ribs, chicken wings, bacon, sausage, bologna, salami, chitterlings,  fatback, hot dogs, bratwurst, and packaged luncheon meats. Salted nuts and seeds. Canned beans with salt. Dairy Whole or 2% milk, cream, half-and-half, and  cream cheese. Whole-fat or sweetened yogurt. Full-fat cheeses or blue cheese. Nondairy creamers and whipped toppings. Processed cheese, cheese spreads, or cheese curds. Condiments Onion and garlic salt, seasoned salt, table salt, and sea salt. Canned and packaged gravies. Worcestershire sauce. Tartar sauce. Barbecue sauce. Teriyaki sauce. Soy sauce, including reduced sodium. Steak sauce. Fish sauce. Oyster sauce. Cocktail sauce. Horseradish. Ketchup and mustard. Meat flavorings and tenderizers. Bouillon cubes. Hot sauce. Tabasco sauce. Marinades. Taco seasonings. Relishes. Fats and Oils Butter, stick margarine, lard, shortening, ghee, and bacon fat. Coconut, palm kernel, or palm oils. Regular salad dressings. Other Pickles and olives. Salted popcorn and pretzels. The items listed above may not be a complete list of foods and beverages to avoid. Contact your dietitian for more information. WHERE CAN I FIND MORE INFORMATION? National Heart, Lung, and Blood Institute: travelstabloid.com   This information is not intended to replace advice given to you by your health care provider. Make sure you discuss any questions you have with your health care provider.   Document Released: 07/14/2011 Document Revised: 08/15/2014 Document Reviewed: 05/29/2013 Elsevier Interactive Patient Education 2016 [employer].  Hypertension Hypertension, commonly called high blood pressure, is when the force of blood pumping through your arteries is too strong. Your arteries are the blood vessels that carry blood from your heart throughout your body. A blood pressure reading consists of a higher number over a lower number, such as 110/72. The higher number (systolic) is the pressure inside your arteries when your heart pumps. The lower number (diastolic) is the pressure inside your arteries when your heart relaxes. Ideally you want your blood pressure below 120/80. Hypertension forces your  heart to work harder to pump blood. Your arteries may become narrow or stiff. Having untreated or uncontrolled hypertension can cause heart attack, stroke, kidney disease, and other problems. RISK FACTORS Some risk factors for high blood pressure are controllable. Others are not.  Risk factors you cannot control include:   Race. You may be at higher risk if you are African American.  Age. Risk increases with age.  Gender. Men are at higher risk than women before age 8 years. After age 64, women are at higher risk than men. Risk factors you can control include:  Not getting enough exercise or physical activity.  Being overweight.  Getting too much fat, sugar, calories, or salt in your diet.  Drinking too much alcohol. SIGNS AND SYMPTOMS Hypertension does not usually cause signs or symptoms. Extremely high blood pressure (hypertensive crisis) may cause headache, anxiety, shortness of breath, and nosebleed. DIAGNOSIS To check if you have hypertension, your health care provider will measure your blood pressure while you are seated, with your arm held at the level of your heart. It should be measured at least twice using the same arm. Certain conditions can cause a difference in blood pressure between your right and left arms. A blood pressure reading that is higher than normal on one occasion does not mean that you need treatment. If it is not clear whether you have high blood pressure, you may be asked to return on a different day to have your blood pressure checked again. Or, you may be asked to monitor your blood pressure at home for 1 or more weeks. TREATMENT Treating high blood pressure includes making lifestyle changes and possibly taking medicine. Living a healthy lifestyle can help  lower high blood pressure. You may need to change some of your habits. Lifestyle changes may include:  Following the DASH diet. This diet is high in fruits, vegetables, and whole grains. It is low in salt,  red meat, and added sugars.  Keep your sodium intake below 2,300 mg per day.  Getting at least 30-45 minutes of aerobic exercise at least 4 times per week.  Losing weight if necessary.  Not smoking.  Limiting alcoholic beverages.  Learning ways to reduce stress. Your health care provider may prescribe medicine if lifestyle changes are not enough to get your blood pressure under control, and if one of the following is true:  You are 56-22 years of age and your systolic blood pressure is above 140.  You are 24 years of age or older, and your systolic blood pressure is above 150.  Your diastolic blood pressure is above 90.  You have diabetes, and your systolic blood pressure is over [id number] or your diastolic blood pressure is over 90.  You have kidney disease and your blood pressure is above 140/90.  You have heart disease and your blood pressure is above 140/90. Your personal target blood pressure may vary depending on your medical conditions, your age, and other factors. HOME CARE INSTRUCTIONS  Have your blood pressure rechecked as directed by your health care provider.   Take medicines only as directed by your health care provider. Follow the directions carefully. Blood pressure medicines must be taken as prescribed. The medicine does not work as well when you skip doses. Skipping doses also puts you at risk for problems.  Do not smoke.   Monitor your blood pressure at home as directed by your health care provider. SEEK MEDICAL CARE IF:   You think you are having a reaction to medicines taken.  You have recurrent headaches or feel dizzy.  You have swelling in your ankles.  You have trouble with your vision. SEEK IMMEDIATE MEDICAL CARE IF:  You develop a severe headache or confusion.  You have unusual weakness, numbness, or feel faint.  You have severe chest or abdominal pain.  You vomit repeatedly.  You have trouble breathing. MAKE SURE YOU:   Understand these  instructions.  Will watch your condition.  Will get help right away if you are not doing well or get worse.   This information is not intended to replace advice given to you by your health care provider. Make sure you discuss any questions you have with your health care provider.   Document Released: 07/25/2005 Document Revised: 12/09/2014 Document Reviewed: 05/17/2013 Elsevier Interactive Patient Education [employer].

## 2016-06-01 ENCOUNTER — Ambulatory Visit (INDEPENDENT_AMBULATORY_CARE_PROVIDER_SITE_OTHER): Payer: BLUE CROSS/BLUE SHIELD | Admitting: Internal Medicine

## 2016-06-01 ENCOUNTER — Encounter: Payer: Self-pay | Admitting: Internal Medicine

## 2016-06-01 VITALS — BP 140/80 | HR 83 | Temp 98.6°F | Resp 20 | Ht 65.25 in | Wt 186.0 lb

## 2016-06-01 DIAGNOSIS — Z23 Encounter for immunization: Secondary | ICD-10-CM

## 2016-06-01 DIAGNOSIS — I10 Essential (primary) hypertension: Secondary | ICD-10-CM | POA: Diagnosis not present

## 2016-06-01 NOTE — Patient Instructions (Signed)
Limit your sodium (Salt) intake  Please check your blood pressure on a regular basis.  If it is consistently greater than 150/90, please make an office appointment.  Return in one year for follow-up  

## 2016-06-01 NOTE — Progress Notes (Signed)
   Subjective:    Patient ID: Preston Williams, male    DOB: [birth year], 50 y.o.   MRN: [id number]  HPI  50 year old patient who is seen today for follow-up of essential hypertension.  He now is on combination therapy and does monitor home blood pressure readings with nice results.  Blood pressures are often in the 120 over 80 range Tolerating the medication well  Past Medical History:  Diagnosis Date  . Arthritis   . Hyperlipidemia   . Hypertension      Social History   Social History  . Marital status: Married    Spouse name: N/A  . Number of children: N/A  . Years of education: N/A   Occupational History  . Not on file.   Social History Main Topics  . Smoking status: Never Smoker  . Smokeless tobacco: Never Used  . Alcohol use 1.8 oz/week    3 Glasses of wine per week  . Drug use: No  . Sexual activity: Yes   Other Topics Concern  . Not on file   Social History Narrative  . No narrative on file    Past Surgical History:  Procedure Laterality Date  . fx of wrist      Family History  Problem Relation Age of Onset  . Stroke Mother   . Hypertension Mother   . Heart disease Father     cabg    No Known Allergies  Current Outpatient Prescriptions on File Prior to Visit  Medication Sig Dispense Refill  . lisinopril-hydrochlorothiazide (PRINZIDE,ZESTORETIC) 20-25 MG tablet Take 1 tablet by mouth daily. 90 tablet 3   No current facility-administered medications on file prior to visit.     BP 140/80 (BP Location: Left Arm, Patient Position: Sitting, Cuff Size: Normal)   Pulse 83   Temp 98.6 F (37 C) (Oral)   Resp 20   Ht 5' 5.25" (1.657 m)   Wt 186 lb (84.4 kg)   SpO2 98%   BMI 30.72 kg/m     Review of Systems  Constitutional: Negative for appetite change, chills, fatigue and fever.  HENT: Negative for congestion, dental problem, ear pain, hearing loss, sore throat, tinnitus, trouble swallowing and voice change.   Eyes: Negative for pain,  discharge and visual disturbance.  Respiratory: Negative for cough, chest tightness, wheezing and stridor.   Cardiovascular: Negative for chest pain, palpitations and leg swelling.  Gastrointestinal: Negative for abdominal distention, abdominal pain, blood in stool, constipation, diarrhea, nausea and vomiting.  Genitourinary: Negative for difficulty urinating, discharge, flank pain, genital sores, hematuria and urgency.  Musculoskeletal: Negative for arthralgias, back pain, gait problem, joint swelling, myalgias and neck stiffness.  Skin: Negative for rash.  Neurological: Negative for dizziness, syncope, speech difficulty, weakness, numbness and headaches.  Hematological: Negative for adenopathy. Does not bruise/bleed easily.  Psychiatric/Behavioral: Negative for behavioral problems and dysphoric mood. The patient is not nervous/anxious.        Objective:   Physical Exam  Constitutional: He appears well-nourished. No distress.  Serial blood pressure readings were obtained with nice.  Correlation with his home blood pressure cuff          Assessment & Plan:  Essential hypertension, well-controlled.  No change in medical regimen.  Medications updated Preventive health.  Flu vaccine administered  CPX one year  Nyoka Cowden

## 2016-06-03 ENCOUNTER — Ambulatory Visit: Payer: BLUE CROSS/BLUE SHIELD | Admitting: Internal Medicine

## 2016-07-20 ENCOUNTER — Ambulatory Visit (INDEPENDENT_AMBULATORY_CARE_PROVIDER_SITE_OTHER): Payer: BLUE CROSS/BLUE SHIELD | Admitting: Family Medicine

## 2016-07-20 ENCOUNTER — Encounter: Payer: Self-pay | Admitting: Family Medicine

## 2016-07-20 VITALS — BP 126/84 | HR 79 | Temp 98.4°F | Wt 188.1 lb

## 2016-07-20 DIAGNOSIS — J029 Acute pharyngitis, unspecified: Secondary | ICD-10-CM | POA: Diagnosis not present

## 2016-07-20 LAB — POCT RAPID STREP A (OFFICE): RAPID STREP A SCREEN: NEGATIVE

## 2016-07-20 NOTE — Progress Notes (Signed)
Subjective:     Patient ID: Preston Williams, male   DOB: 08/16/1965, 50 y.o.   MRN: [id number]  HPI Patient seen with sore throat for 1 week duration. He has had some mild nasal congestion. Possible low-grade fever initially. Symptoms are somewhat improved compared to couple days ago. Denies any cough. Initially had some mild myalgias but those are improved as well. No nausea or vomiting. No skin rash. No sick contacts. Symptoms improved with Advil.  Past Medical History:  Diagnosis Date  . Arthritis   . Hyperlipidemia   . Hypertension    Past Surgical History:  Procedure Laterality Date  . fx of wrist      reports that he has never smoked. He has never used smokeless tobacco. He reports that he drinks about 1.8 oz of alcohol per week . He reports that he does not use drugs. family history includes Heart disease in his father; Hypertension in his mother; Stroke in his mother. No Known Allergies   Review of Systems  Constitutional: Negative for fever.  HENT: Positive for congestion and sore throat.   Respiratory: Negative for cough.   Neurological: Negative for headaches.       Objective:   Physical Exam  Constitutional: He appears well-developed and well-nourished.  HENT:  Right Ear: External ear normal.  Left Ear: External ear normal.  Mouth/Throat: No oropharyngeal exudate.  Mild to moderate posterior pharynx erythema. No exudate.  Neck: Neck supple.  Cardiovascular: Normal rate and regular rhythm.   Pulmonary/Chest: Effort normal and breath sounds normal. No respiratory distress. He has no wheezes. He has no rales.  Lymphadenopathy:    He has no cervical adenopathy.       Assessment:     Acute pharyngitis. Rapid strep negative. Suspect viral    Plan:     -Treat symptomatically with over-the-counter Chloroseptic spray and Advil or Aleve for sore throat symptoms -Follow-up for any fever or other change of symptoms  Eulas Post MD [facility] at  [facility]

## 2016-07-20 NOTE — Patient Instructions (Signed)

## 2016-07-20 NOTE — Progress Notes (Signed)
Pre visit review using our clinic review tool, if applicable. No additional management support is needed unless otherwise documented below in the visit note. 

## 2017-02-22 DIAGNOSIS — H1045 Other chronic allergic conjunctivitis: Secondary | ICD-10-CM | POA: Diagnosis not present

## 2017-04-06 ENCOUNTER — Other Ambulatory Visit: Payer: BLUE CROSS/BLUE SHIELD

## 2017-04-11 ENCOUNTER — Encounter: Payer: Self-pay | Admitting: Internal Medicine

## 2017-04-11 ENCOUNTER — Ambulatory Visit (INDEPENDENT_AMBULATORY_CARE_PROVIDER_SITE_OTHER): Payer: BLUE CROSS/BLUE SHIELD | Admitting: Internal Medicine

## 2017-04-11 ENCOUNTER — Telehealth: Payer: Self-pay | Admitting: Internal Medicine

## 2017-04-11 VITALS — BP 142/80 | HR 74 | Temp 98.0°F | Ht 65.0 in | Wt 187.4 lb

## 2017-04-11 DIAGNOSIS — E785 Hyperlipidemia, unspecified: Secondary | ICD-10-CM | POA: Diagnosis not present

## 2017-04-11 DIAGNOSIS — I10 Essential (primary) hypertension: Secondary | ICD-10-CM | POA: Diagnosis not present

## 2017-04-11 DIAGNOSIS — Z0000 Encounter for general adult medical examination without abnormal findings: Secondary | ICD-10-CM | POA: Diagnosis not present

## 2017-04-11 LAB — CBC WITH DIFFERENTIAL/PLATELET
BASOS ABS: 0 10*3/uL (ref 0.0–0.1)
BASOS PCT: 0.8 % (ref 0.0–3.0)
EOS PCT: 2.2 % (ref 0.0–5.0)
Eosinophils Absolute: 0.1 10*3/uL (ref 0.0–0.7)
HEMATOCRIT: 42.3 % (ref 39.0–52.0)
Hemoglobin: 14.8 g/dL (ref 13.0–17.0)
LYMPHS PCT: 23.8 % (ref 12.0–46.0)
Lymphs Abs: 1.5 10*3/uL (ref 0.7–4.0)
MCHC: 35 g/dL (ref 30.0–36.0)
MCV: 92.2 fl (ref 78.0–100.0)
MONOS PCT: 8.7 % (ref 3.0–12.0)
Monocytes Absolute: 0.5 10*3/uL (ref 0.1–1.0)
NEUTROS ABS: 4 10*3/uL (ref 1.4–7.7)
Neutrophils Relative %: 64.5 % (ref 43.0–77.0)
PLATELETS: 192 10*3/uL (ref 150.0–400.0)
RBC: 4.59 Mil/uL (ref 4.22–5.81)
RDW: 12.2 % (ref 11.5–15.5)
WBC: 6.1 10*3/uL (ref 4.0–10.5)

## 2017-04-11 LAB — LIPID PANEL
CHOL/HDL RATIO: 6
Cholesterol: 258 mg/dL — ABNORMAL HIGH (ref 0–200)
HDL: 43.5 mg/dL (ref >39.00)
NONHDL: 214.31
TRIGLYCERIDES: 385 mg/dL — AB (ref 0.0–149.0)
VLDL: 77 mg/dL — ABNORMAL HIGH (ref 0.0–40.0)

## 2017-04-11 LAB — COMPREHENSIVE METABOLIC PANEL
ALT: 42 U/L (ref 0–53)
AST: 32 U/L (ref 0–37)
Albumin: 4.4 g/dL (ref 3.5–5.2)
Alkaline Phosphatase: 63 U/L (ref 39–117)
BUN: 13 mg/dL (ref 6–23)
CHLORIDE: 97 meq/L (ref 96–112)
CO2: 30 meq/L (ref 19–32)
Calcium: 9.4 mg/dL (ref 8.4–10.5)
Creatinine, Ser: 0.75 mg/dL (ref 0.40–1.50)
GFR: 117.49 mL/min (ref >60.00)
GLUCOSE: 92 mg/dL (ref 70–99)
POTASSIUM: 3.8 meq/L (ref 3.5–5.1)
SODIUM: 137 meq/L (ref 135–145)
Total Bilirubin: 0.6 mg/dL (ref 0.2–1.2)
Total Protein: 7.1 g/dL (ref 6.0–8.3)

## 2017-04-11 LAB — PSA: PSA: 0.42 ng/mL (ref 0.10–4.00)

## 2017-04-11 LAB — TSH: TSH: 1.04 u[IU]/mL (ref 0.35–4.50)

## 2017-04-11 LAB — LDL CHOLESTEROL, DIRECT: Direct LDL: 118 mg/dL

## 2017-04-11 MED ORDER — LISINOPRIL-HYDROCHLOROTHIAZIDE 20-25 MG PO TABS: 1.0000 | ORAL_TABLET | Freq: Every day | ORAL | 6 refills | Status: DC

## 2017-04-11 NOTE — Patient Instructions (Addendum)
Limit your sodium (Salt) intake  Please check your blood pressure on a regular basis.  If it is consistently greater than 150/90, please make an office appointment.    It is important that you exercise regularly, at least 20 minutes 3 to 4 times per week.  If you develop chest pain or shortness of breath seek  medical attention.  Return in one year for follow-up Food Choices to Lower Your Triglycerides Triglycerides are a type of fat in your blood. High levels of triglycerides can increase the risk of heart disease and stroke. If your triglyceride levels are high, the foods you eat and your eating habits are very important. Choosing the right foods can help lower your triglycerides. What general guidelines do I need to follow?  Lose weight if you are overweight.  Limit or avoid alcohol.  Fill one half of your plate with vegetables and green salads.  Limit fruit to two servings a day. Choose fruit instead of juice.  Make one fourth of your plate whole grains. Look for the word "whole" as the first word in the ingredient list.  Fill one fourth of your plate with lean protein foods.  Enjoy fatty fish (such as salmon, mackerel, sardines, and tuna) three times a week.  Choose healthy fats.  Limit foods high in starch and sugar.  Eat more home-cooked food and less restaurant, buffet, and fast food.  Limit fried foods.  Cook foods using methods other than frying.  Limit saturated fats.  Check ingredient lists to avoid foods with partially hydrogenated oils (trans fats) in them. What foods can I eat? Grains Whole grains, such as whole wheat or whole grain breads, crackers, cereals, and pasta. Unsweetened oatmeal, bulgur, barley, quinoa, or brown rice. Corn or whole wheat flour tortillas. Vegetables Fresh or frozen vegetables (raw, steamed, roasted, or grilled). Green salads. Fruits All fresh, canned (in natural juice), or frozen fruits. Meat and Other Protein Products Ground  beef (85% or leaner), grass-fed beef, or beef trimmed of fat. Skinless chicken or [place]. Ground chicken or [place]. Pork trimmed of fat. All fish and seafood. Eggs. Dried beans, peas, or lentils. Unsalted nuts or seeds. Unsalted canned or dry beans. Dairy Low-fat dairy products, such as skim or 1% milk, 2% or reduced-fat cheeses, low-fat ricotta or cottage cheese, or plain low-fat yogurt. Fats and Oils Tub margarines without trans fats. Light or reduced-fat mayonnaise and salad dressings. Avocado. Safflower, olive, or canola oils. Natural peanut or almond butter. The items listed above may not be a complete list of recommended foods or beverages. Contact your dietitian for more options. What foods are not recommended? Grains White bread. White pasta. White rice. Cornbread. Bagels, pastries, and croissants. Crackers that contain trans fat. Vegetables White potatoes. Corn. Creamed or fried vegetables. Vegetables in a cheese sauce. Fruits Dried fruits. Canned fruit in light or heavy syrup. Fruit juice. Meat and Other Protein Products Fatty cuts of meat. Ribs, chicken wings, bacon, sausage, bologna, salami, chitterlings, fatback, hot dogs, bratwurst, and packaged luncheon meats. Dairy Whole or 2% milk, cream, half-and-half, and cream cheese. Whole-fat or sweetened yogurt. Full-fat cheeses. Nondairy creamers and whipped toppings. Processed cheese, cheese spreads, or cheese curds. Sweets and Desserts Corn syrup, sugars, honey, and molasses. Candy. Jam and jelly. Syrup. Sweetened cereals. Cookies, pies, cakes, donuts, muffins, and ice cream. Fats and Oils Butter, stick margarine, lard, shortening, ghee, or bacon fat. Coconut, palm kernel, or palm oils. Beverages Alcohol. Sweetened drinks (such as sodas, lemonade, and fruit drinks or  punches). The items listed above may not be a complete list of foods and beverages to avoid. Contact your dietitian for more information. This information is not  intended to replace advice given to you by your health care provider. Make sure you discuss any questions you have with your health care provider. Document Released: 05/12/2004 Document Revised: 12/31/2015 Document Reviewed: 05/29/2013 Elsevier Interactive Patient Education  2017 [employer].

## 2017-04-11 NOTE — Progress Notes (Signed)
Subjective:    Patient ID: Preston Williams, male    DOB: [birth year], 51 y.o.   MRN: [id number]  HPI  51 year old patient who is seen today for a preventive health examination He has a history of essential hypertension and was placed on therapy about one year ago.  He does monitor home blood pressure readings with nice results  No concerns or complaints Family history: Both parents age 63,  father with coronary artery disease status post angioplasty.  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 Arthritis   . Hyperlipidemia   . Hypertension      Social History   Social History  . Marital status: Married    Spouse name: N/A  . Number of children: N/A  . Years of education: N/A   Occupational History  . Not on file.   Social History Main Topics  . Smoking status: Never Smoker  . Smokeless tobacco: Never Used  . Alcohol use 1.8 oz/week    3 Glasses of wine per week  . Drug use: No  . Sexual activity: Yes   Other Topics Concern  . Not on file   Social History Narrative  . No narrative on file    Past Surgical History:  Procedure Laterality Date  . fx of wrist      Family History  Problem Relation Age of Onset  . Stroke Mother   . Hypertension Mother   . Heart disease Father        cabg    No Known Allergies  No current outpatient prescriptions on file prior to visit.   No current facility-administered medications on file prior to visit.     BP (!) 142/80 (BP Location: Left Arm, Patient Position: Sitting, Cuff Size: Normal)   Pulse 74   Temp 98 F (36.7 C) (Oral)   Ht 5\' 5"  (1.651 m)   Wt 187 lb 6.4 oz (85 kg)   SpO2 96%   BMI 31.18 kg/m     Review of Systems  Constitutional: Negative for appetite change, chills, fatigue and fever.  HENT: Negative for congestion, dental problem, ear pain,  hearing loss, sore throat, tinnitus, trouble swallowing and voice change.   Eyes: Negative for pain, discharge and visual disturbance.  Respiratory: Negative for cough, chest tightness, wheezing and stridor.   Cardiovascular: Negative for chest pain, palpitations and leg swelling.  Gastrointestinal: Negative for abdominal distention, abdominal pain, blood in stool, constipation, diarrhea, nausea and vomiting.  Genitourinary: Negative for difficulty urinating, discharge, flank pain, genital sores, hematuria and urgency.  Musculoskeletal: Negative for arthralgias, back pain, gait problem, joint swelling, myalgias and neck stiffness.  Skin: Negative for rash.  Neurological: Negative for dizziness, syncope, speech difficulty, weakness, numbness and headaches.  Hematological: Negative for adenopathy. Does not bruise/bleed easily.  Psychiatric/Behavioral: Negative for behavioral problems and dysphoric mood. The patient is not nervous/anxious.        Objective:   Physical Exam  Constitutional: He appears well-developed and well-nourished.  Weight 187 Blood pressure 134/80  HENT:  Head: Normocephalic and atraumatic.  Right Ear: External ear normal.  Left Ear: External ear normal.  Nose: Nose normal.  Mouth/Throat: Oropharynx is clear and moist.  Eyes: Pupils are equal, round, and reactive to light. Conjunctivae and EOM are normal. No scleral icterus.  Neck: Normal range of motion. Neck supple.  No JVD present. No thyromegaly present.  Cardiovascular: Regular rhythm, normal heart sounds and intact distal pulses.  Exam reveals no gallop and no friction rub.   No murmur heard. Pulmonary/Chest: Effort normal and breath sounds normal. He exhibits no tenderness.  Abdominal: Soft. Bowel sounds are normal. He exhibits no distension and no mass. There is no tenderness.  Genitourinary: Prostate normal and penis normal. Rectal exam shows guaiac negative stool.  Musculoskeletal: Normal range of motion. He  exhibits no edema or tenderness.  Lymphadenopathy:    He has no cervical adenopathy.  Neurological: He is alert. He has normal reflexes. No cranial nerve deficit. Coordination normal.  Skin: Skin is warm and dry. No rash noted.  Psychiatric: He has a normal mood and affect. His behavior is normal.          Assessment & Plan:   Preventive health examination Essential hypertension, well-controlled Mild dyslipidemia  No change in medical regimen Review updated lab Colonoscopy 1 year Follow-up here one year Medications updated  Nyoka Cowden

## 2017-04-11 NOTE — Telephone Encounter (Signed)
Spoke to pt in regards to his lab work.

## 2017-04-11 NOTE — Telephone Encounter (Signed)
Pt would like a call back to discuss his lab results

## 2017-04-12 ENCOUNTER — Other Ambulatory Visit: Payer: Self-pay | Admitting: Internal Medicine

## 2017-04-27 ENCOUNTER — Encounter: Payer: Self-pay | Admitting: Internal Medicine

## 2018-02-12 DIAGNOSIS — M9903 Segmental and somatic dysfunction of lumbar region: Secondary | ICD-10-CM | POA: Diagnosis not present

## 2018-02-12 DIAGNOSIS — M5416 Radiculopathy, lumbar region: Secondary | ICD-10-CM | POA: Diagnosis not present

## 2018-02-12 DIAGNOSIS — M9905 Segmental and somatic dysfunction of pelvic region: Secondary | ICD-10-CM | POA: Diagnosis not present

## 2018-02-12 DIAGNOSIS — M955 Acquired deformity of pelvis: Secondary | ICD-10-CM | POA: Diagnosis not present

## 2018-02-13 DIAGNOSIS — M9905 Segmental and somatic dysfunction of pelvic region: Secondary | ICD-10-CM | POA: Diagnosis not present

## 2018-02-13 DIAGNOSIS — M955 Acquired deformity of pelvis: Secondary | ICD-10-CM | POA: Diagnosis not present

## 2018-02-13 DIAGNOSIS — M9903 Segmental and somatic dysfunction of lumbar region: Secondary | ICD-10-CM | POA: Diagnosis not present

## 2018-02-13 DIAGNOSIS — M5416 Radiculopathy, lumbar region: Secondary | ICD-10-CM | POA: Diagnosis not present

## 2018-02-19 DIAGNOSIS — M9905 Segmental and somatic dysfunction of pelvic region: Secondary | ICD-10-CM | POA: Diagnosis not present

## 2018-02-19 DIAGNOSIS — M5416 Radiculopathy, lumbar region: Secondary | ICD-10-CM | POA: Diagnosis not present

## 2018-02-19 DIAGNOSIS — M955 Acquired deformity of pelvis: Secondary | ICD-10-CM | POA: Diagnosis not present

## 2018-02-19 DIAGNOSIS — M9903 Segmental and somatic dysfunction of lumbar region: Secondary | ICD-10-CM | POA: Diagnosis not present

## 2018-04-17 ENCOUNTER — Encounter: Payer: Self-pay | Admitting: Gastroenterology

## 2018-04-17 ENCOUNTER — Ambulatory Visit (INDEPENDENT_AMBULATORY_CARE_PROVIDER_SITE_OTHER): Payer: BLUE CROSS/BLUE SHIELD | Admitting: Internal Medicine

## 2018-04-17 ENCOUNTER — Encounter: Payer: Self-pay | Admitting: Internal Medicine

## 2018-04-17 VITALS — BP 110/80 | HR 71 | Temp 98.9°F | Wt 184.6 lb

## 2018-04-17 DIAGNOSIS — Z125 Encounter for screening for malignant neoplasm of prostate: Secondary | ICD-10-CM

## 2018-04-17 DIAGNOSIS — Z0000 Encounter for general adult medical examination without abnormal findings: Secondary | ICD-10-CM

## 2018-04-17 LAB — CBC WITH DIFFERENTIAL/PLATELET
BASOS ABS: 0 10*3/uL (ref 0.0–0.1)
BASOS PCT: 0.5 % (ref 0.0–3.0)
EOS ABS: 0.1 10*3/uL (ref 0.0–0.7)
Eosinophils Relative: 1.6 % (ref 0.0–5.0)
HCT: 43.2 % (ref 39.0–52.0)
HEMOGLOBIN: 15.2 g/dL (ref 13.0–17.0)
LYMPHS PCT: 25.1 % (ref 12.0–46.0)
Lymphs Abs: 1.7 10*3/uL (ref 0.7–4.0)
MCHC: 35.2 g/dL (ref 30.0–36.0)
MCV: 90.6 fl (ref 78.0–100.0)
MONO ABS: 0.6 10*3/uL (ref 0.1–1.0)
Monocytes Relative: 9.3 % (ref 3.0–12.0)
Neutro Abs: 4.2 10*3/uL (ref 1.4–7.7)
Neutrophils Relative %: 63.5 % (ref 43.0–77.0)
Platelets: 195 10*3/uL (ref 150.0–400.0)
RBC: 4.77 Mil/uL (ref 4.22–5.81)
RDW: 12.8 % (ref 11.5–15.5)
WBC: 6.6 10*3/uL (ref 4.0–10.5)

## 2018-04-17 LAB — COMPREHENSIVE METABOLIC PANEL
ALBUMIN: 4.6 g/dL (ref 3.5–5.2)
ALT: 41 U/L (ref 0–53)
AST: 27 U/L (ref 0–37)
Alkaline Phosphatase: 62 U/L (ref 39–117)
BILIRUBIN TOTAL: 0.8 mg/dL (ref 0.2–1.2)
BUN: 14 mg/dL (ref 6–23)
CALCIUM: 9.4 mg/dL (ref 8.4–10.5)
CHLORIDE: 96 meq/L (ref 96–112)
CO2: 32 mEq/L (ref 19–32)
CREATININE: 0.74 mg/dL (ref 0.40–1.50)
GFR: 118.83 mL/min (ref >60.00)
Glucose, Bld: 85 mg/dL (ref 70–99)
Potassium: 4.2 mEq/L (ref 3.5–5.1)
SODIUM: 136 meq/L (ref 135–145)
TOTAL PROTEIN: 7.6 g/dL (ref 6.0–8.3)

## 2018-04-17 LAB — LIPID PANEL
Cholesterol: 288 mg/dL — ABNORMAL HIGH (ref 0–200)
HDL: 54.3 mg/dL (ref >39.00)
NonHDL: 233.25
Total CHOL/HDL Ratio: 5
Triglycerides: 218 mg/dL — ABNORMAL HIGH (ref 0.0–149.0)
VLDL: 43.6 mg/dL — AB (ref 0.0–40.0)

## 2018-04-17 LAB — TSH: TSH: 0.93 u[IU]/mL (ref 0.35–4.50)

## 2018-04-17 LAB — PSA: PSA: 0.41 ng/mL (ref 0.10–4.00)

## 2018-04-17 LAB — LDL CHOLESTEROL, DIRECT: LDL DIRECT: 195 mg/dL

## 2018-04-17 MED ORDER — LISINOPRIL-HYDROCHLOROTHIAZIDE 20-25 MG PO TABS: 1.0000 | ORAL_TABLET | Freq: Every day | ORAL | 4 refills | Status: AC

## 2018-04-17 MED ORDER — LISINOPRIL-HYDROCHLOROTHIAZIDE 20-25 MG PO TABS: 1.0000 | ORAL_TABLET | Freq: Every day | ORAL | 3 refills | Status: DC

## 2018-04-17 NOTE — Progress Notes (Signed)
Subjective:    Patient ID: Preston Williams, male    DOB: July 03, 1966, 52 y.o.   MRN: [id number]  HPI  Wt Readings from Last 3 Encounters:  04/17/18 184 lb 9.6 oz (83.7 kg)  04/11/17 187 lb 6.4 oz (85 kg)  07/20/16 188 lb 1.6 oz (41.86 kg)   51 year old patient who is seen today for a preventive health examination. He enjoys excellent health and has no real concerns or complaints.  He does have a history of essential hypertension this has been well controlled  Family history.  Father history of coronary artery disease status post angioplasty.  Father has developed some dementia mother has a history of hypertension.  2 brothers are in good health  Social history married one son one daughter ages 20 and 49.  Non-smoker  Past Medical History:  Diagnosis Date  . Arthritis   . Hyperlipidemia   . Hypertension      Social History   Socioeconomic History  . Marital status: Married    Spouse name: Not on file  . Number of children: Not on file  . Years of education: Not on file  . Highest education level: Not on file  Occupational History  . Not on file  Social Needs  . Financial resource strain: Not on file  . Food insecurity:    Worry: Not on file    Inability: Not on file  . Transportation needs:    Medical: Not on file    Non-medical: Not on file  Tobacco Use  . Smoking status: Never Smoker  . Smokeless tobacco: Never Used  Substance and Sexual Activity  . Alcohol use: Yes    Alcohol/week: 3.0 standard drinks    Types: 3 Glasses of wine per week  . Drug use: No  . Sexual activity: Yes  Lifestyle  . Physical activity:    Days per week: Not on file    Minutes per session: Not on file  . Stress: Not on file  Relationships  . Social connections:    Talks on phone: Not on file    Gets together: Not on file    Attends religious service: Not on file    Active member of club or organization: Not on file    Attends meetings of clubs or organizations: Not on file   Relationship status: Not on file  . Intimate partner violence:    Fear of current or ex partner: Not on file    Emotionally abused: Not on file    Physically abused: Not on file    Forced sexual activity: Not on file  Other Topics Concern  . Not on file  Social History Narrative  . Not on file    Past Surgical History:  Procedure Laterality Date  . fx of wrist      Family History  Problem Relation Age of Onset  . Stroke Mother   . Hypertension Mother   . Heart disease Father        cabg    No Known Allergies  No current outpatient medications on file prior to visit.   No current facility-administered medications on file prior to visit.     BP 110/80 (BP Location: Right Arm, Patient Position: Sitting, Cuff Size: Normal)   Pulse 71   Temp 98.9 F (37.2 C) (Oral)   Wt 184 lb 9.6 oz (83.7 kg)   SpO2 99%   BMI 30.72 kg/m      Review of Systems  Constitutional: Negative  for activity change, appetite change, chills, fatigue and fever.  HENT: Negative for congestion, dental problem, ear pain, hearing loss, mouth sores, rhinorrhea, sinus pressure, sneezing, tinnitus, trouble swallowing and voice change.   Eyes: Negative for photophobia, pain, redness and visual disturbance.  Respiratory: Negative for apnea, cough, choking, chest tightness, shortness of breath and wheezing.   Cardiovascular: Negative for chest pain, palpitations and leg swelling.  Gastrointestinal: Negative for abdominal distention, abdominal pain, anal bleeding, blood in stool, constipation, diarrhea, nausea, rectal pain and vomiting.  Genitourinary: Negative for decreased urine volume, difficulty urinating, discharge, dysuria, flank pain, frequency, genital sores, hematuria, penile swelling, scrotal swelling, testicular pain and urgency.  Musculoskeletal: Negative for arthralgias, back pain, gait problem, joint swelling, myalgias, neck pain and neck stiffness.  Skin: Negative for color change, rash and  wound.  Neurological: Negative for dizziness, tremors, seizures, syncope, facial asymmetry, speech difficulty, weakness, light-headedness, numbness and headaches.  Hematological: Negative for adenopathy. Does not bruise/bleed easily.  Psychiatric/Behavioral: Negative for agitation, behavioral problems, confusion, decreased concentration, dysphoric mood, hallucinations, self-injury, sleep disturbance and suicidal ideas. The patient is not nervous/anxious.        Objective:   Physical Exam  Constitutional: He appears well-developed and well-nourished.  HENT:  Head: Normocephalic and atraumatic.  Right Ear: External ear normal.  Left Ear: External ear normal.  Nose: Nose normal.  Mouth/Throat: Oropharynx is clear and moist.  Eyes: Pupils are equal, round, and reactive to light. Conjunctivae and EOM are normal. No scleral icterus.  Neck: Normal range of motion. Neck supple. No JVD present. No thyromegaly present.  Cardiovascular: Regular rhythm, normal heart sounds and intact distal pulses. Exam reveals no gallop and no friction rub.  No murmur heard. Slight decreased right dorsalis pedis pulse  Pulmonary/Chest: Effort normal and breath sounds normal. He exhibits no tenderness.  Abdominal: Soft. Bowel sounds are normal. He exhibits no distension and no mass. There is no tenderness.  Slight ventral hernia superior to the umbilicus  Genitourinary: Prostate normal and penis normal. Rectal exam shows guaiac negative stool.  Musculoskeletal: Normal range of motion. He exhibits no edema or tenderness.  Lymphadenopathy:    He has no cervical adenopathy.  Neurological: He is alert. He has normal reflexes. No cranial nerve deficit. Coordination normal.  Skin: Skin is warm and dry. No rash noted.  Psychiatric: He has a normal mood and affect. His behavior is normal.          Assessment & Plan:   Preventive health examination History of hypertriglyceridemia.  Will review a lipid profile will  continue heart healthy diet and regular exercise Essential hypertension well-controlled  Continue home blood pressure monitoring and active lifestyle Follow-up 1 year or as needed Lisinopril hydrochlorothiazide refilled  Marletta Lor

## 2018-04-17 NOTE — Patient Instructions (Addendum)
Limit your sodium (Salt) intake  Please check your blood pressure on a regular basis.  If it is consistently greater than 140/90, please make an office appointment.    It is important that you exercise regularly, at least 20 minutes 3 to 4 times per week.  If you develop chest pain or shortness of breath seek  medical attention.  Schedule your colonoscopy to help detect colon cancer.  Return in one year for follow-up   Food Choices to Lower Your Triglycerides Triglycerides are a type of fat in your blood. High levels of triglycerides can increase the risk of heart disease and stroke. If your triglyceride levels are high, the foods you eat and your eating habits are very important. Choosing the right foods can help lower your triglycerides. What general guidelines do I need to follow?  Lose weight if you are overweight.  Limit or avoid alcohol.  Fill one half of your plate with vegetables and green salads.  Limit fruit to two servings a day. Choose fruit instead of juice.  Make one fourth of your plate whole grains. Look for the word "whole" as the first word in the ingredient list.  Fill one fourth of your plate with lean protein foods.  Enjoy fatty fish (such as salmon, mackerel, sardines, and tuna) three times a week.  Choose healthy fats.  Limit foods high in starch and sugar.  Eat more home-cooked food and less restaurant, buffet, and fast food.  Limit fried foods.  Cook foods using methods other than frying.  Limit saturated fats.  Check ingredient lists to avoid foods with partially hydrogenated oils (trans fats) in them. What foods can I eat? Grains Whole grains, such as whole wheat or whole grain breads, crackers, cereals, and pasta. Unsweetened oatmeal, bulgur, barley, quinoa, or brown rice. Corn or whole wheat flour tortillas. Vegetables Fresh or frozen vegetables (raw, steamed, roasted, or grilled). Green salads. Fruits All fresh, canned (in natural juice),  or frozen fruits. Meat and Other Protein Products Ground beef (85% or leaner), grass-fed beef, or beef trimmed of fat. Skinless chicken or [place]. Ground chicken or [place]. Pork trimmed of fat. All fish and seafood. Eggs. Dried beans, peas, or lentils. Unsalted nuts or seeds. Unsalted canned or dry beans. Dairy Low-fat dairy products, such as skim or 1% milk, 2% or reduced-fat cheeses, low-fat ricotta or cottage cheese, or plain low-fat yogurt. Fats and Oils Tub margarines without trans fats. Light or reduced-fat mayonnaise and salad dressings. Avocado. Safflower, olive, or canola oils. Natural peanut or almond butter. The items listed above may not be a complete list of recommended foods or beverages. Contact your dietitian for more options. What foods are not recommended? Grains White bread. White pasta. White rice. Cornbread. Bagels, pastries, and croissants. Crackers that contain trans fat. Vegetables White potatoes. Corn. Creamed or fried vegetables. Vegetables in a cheese sauce. Fruits Dried fruits. Canned fruit in light or heavy syrup. Fruit juice. Meat and Other Protein Products Fatty cuts of meat. Ribs, chicken wings, bacon, sausage, bologna, salami, chitterlings, fatback, hot dogs, bratwurst, and packaged luncheon meats. Dairy Whole or 2% milk, cream, half-and-half, and cream cheese. Whole-fat or sweetened yogurt. Full-fat cheeses. Nondairy creamers and whipped toppings. Processed cheese, cheese spreads, or cheese curds. Sweets and Desserts Corn syrup, sugars, honey, and molasses. Candy. Jam and jelly. Syrup. Sweetened cereals. Cookies, pies, cakes, donuts, muffins, and ice cream. Fats and Oils Butter, stick margarine, lard, shortening, ghee, or bacon fat. Coconut, palm kernel, or palm oils. Beverages  Alcohol. Sweetened drinks (such as sodas, lemonade, and fruit drinks or punches). The items listed above may not be a complete list of foods and beverages to avoid. Contact your  dietitian for more information. This information is not intended to replace advice given to you by your health care provider. Make sure you discuss any questions you have with your health care provider. Document Released: 05/12/2004 Document Revised: 12/31/2015 Document Reviewed: 05/29/2013 Elsevier Interactive Patient Education  2017 [employer].

## 2018-05-15 ENCOUNTER — Other Ambulatory Visit: Payer: Self-pay

## 2018-05-15 ENCOUNTER — Encounter: Payer: Self-pay | Admitting: Gastroenterology

## 2018-05-15 ENCOUNTER — Ambulatory Visit (AMBULATORY_SURGERY_CENTER): Payer: Self-pay | Admitting: *Deleted

## 2018-05-15 VITALS — Ht 66.0 in | Wt 186.4 lb

## 2018-05-15 DIAGNOSIS — Z1211 Encounter for screening for malignant neoplasm of colon: Secondary | ICD-10-CM

## 2018-05-15 MED ORDER — SUPREP BOWEL PREP KIT 17.5-3.13-1.6 GM/177ML PO SOLN: 1.0000 | Freq: Once | ORAL | 0 refills | Status: AC

## 2018-05-15 NOTE — Progress Notes (Signed)
No egg or soy allergy known to patient  No issues with past sedation with any surgeries  or procedures, no intubation problems  No diet pills per patient No home 02 use per patient  No blood thinners per patient  Pt denies issues with constipation  No A fib or A flutter  EMMI video offered and declined by the patient. 15 dollar coupon for suprep given

## 2018-05-31 ENCOUNTER — Encounter: Payer: Self-pay | Admitting: Gastroenterology

## 2018-05-31 ENCOUNTER — Ambulatory Visit (AMBULATORY_SURGERY_CENTER): Payer: BLUE CROSS/BLUE SHIELD | Admitting: Gastroenterology

## 2018-05-31 VITALS — BP 117/76 | HR 74 | Temp 98.6°F | Resp 10 | Ht 66.0 in | Wt 184.0 lb

## 2018-05-31 DIAGNOSIS — D123 Benign neoplasm of transverse colon: Secondary | ICD-10-CM | POA: Diagnosis not present

## 2018-05-31 DIAGNOSIS — K635 Polyp of colon: Secondary | ICD-10-CM

## 2018-05-31 DIAGNOSIS — Z1211 Encounter for screening for malignant neoplasm of colon: Secondary | ICD-10-CM | POA: Diagnosis not present

## 2018-05-31 DIAGNOSIS — D125 Benign neoplasm of sigmoid colon: Secondary | ICD-10-CM

## 2018-05-31 HISTORY — PX: COLONOSCOPY: SHX174

## 2018-05-31 MED ORDER — SODIUM CHLORIDE 0.9 % IV SOLN: 500.0000 mL | Freq: Once | INTRAVENOUS | Status: DC

## 2018-05-31 NOTE — Progress Notes (Signed)
Called to room to assist during endoscopic procedure.  Patient ID and intended procedure confirmed with present staff. Received instructions for my participation in the procedure from the performing physician.  

## 2018-05-31 NOTE — Progress Notes (Signed)
Pt's states no medical or surgical changes since previsit or office visit. 

## 2018-05-31 NOTE — Progress Notes (Signed)
To PACU, VSS. Report to [email or web address] 

## 2018-05-31 NOTE — Op Note (Signed)
[place] Patient Name: Preston Williams Procedure Date: 05/31/2018 9:03 AM MRN: [id number] Endoscopist: Remo Lipps P. Havery Moros , MD Age: 52 Referring MD:  Date of Birth: 19-Jan-1966 Gender: Male Account #: [id number] Procedure:                Colonoscopy Indications:              Screening for colorectal malignant neoplasm, This                            is the patient's first colonoscopy Medicines:                Monitored Anesthesia Care Procedure:                Pre-Anesthesia Assessment:                           - Prior to the procedure, a History and Physical                            was performed, and patient medications and                            allergies were reviewed. The patient's tolerance of                            previous anesthesia was also reviewed. The risks                            and benefits of the procedure and the sedation                            options and risks were discussed with the patient.                            All questions were answered, and informed consent                            was obtained. Prior Anticoagulants: The patient has                            taken no previous anticoagulant or antiplatelet                            agents. ASA Grade Assessment: II - A patient with                            mild systemic disease. After reviewing the risks                            and benefits, the patient was deemed in                            satisfactory condition to undergo the procedure.  After obtaining informed consent, the colonoscope                            was passed under direct vision. Throughout the                            procedure, the patient's blood pressure, pulse, and                            oxygen saturations were monitored continuously. The                            Model PCF-[id number]) scope was introduced                            through the anus  and advanced to the the cecum,                            identified by appendiceal orifice and ileocecal                            valve. The colonoscopy was performed without                            difficulty. The patient tolerated the procedure                            well. The quality of the bowel preparation was                            good. The ileocecal valve, appendiceal orifice, and                            rectum were photographed. Scope In: 9:07:49 AM Scope Out: 9:27:17 AM Scope Withdrawal Time: 0 hours 16 minutes 35 seconds  Total Procedure Duration: 0 hours 19 minutes 28 seconds  Findings:                 The perianal and digital rectal examinations were                            normal.                           A 8 mm polyp was found in the hepatic flexure. The                            polyp was sessile. The polyp was removed with a                            cold snare. Resection and retrieval were complete.                           Two sessile polyps were found in the transverse  colon. The polyps were 3 to 5 mm in size. These                            polyps were removed with a cold snare. Resection                            and retrieval were complete.                           Three sessile polyps were found in the sigmoid                            colon. The polyps were 3 to 4 mm in size. These                            polyps were removed with a cold snare. Resection                            and retrieval were complete.                           Internal hemorrhoids were found during retroflexion.                           The exam was otherwise without abnormality. Complications:            No immediate complications. Estimated blood loss:                            Minimal. Estimated Blood Loss:     Estimated blood loss was minimal. Impression:               - One 8 mm polyp in the ascending colon, removed                             with a cold snare. Resected and retrieved.                           - Two 3 to 5 mm polyps in the transverse colon,                            removed with a cold snare. Resected and retrieved.                           - Three 3 to 4 mm polyps in the sigmoid colon,                            removed with a cold snare. Resected and retrieved.                           - Internal hemorrhoids.                           - The examination was  otherwise normal. Recommendation:           - Patient has a contact number available for                            emergencies. The signs and symptoms of potential                            delayed complications were discussed with the                            patient. Return to normal activities tomorrow.                            Written discharge instructions were provided to the                            patient.                           - Resume previous diet.                           - Continue present medications.                           - Await pathology results. Remo Lipps P. Armbruster, MD 05/31/2018 9:32:05 AM This report has been signed electronically.

## 2018-05-31 NOTE — Patient Instructions (Signed)
Impression/Recommendations:  Polyp handout given to patient. Hemorrhoid handout given to patient.  Resume previsou diet. Continue present medications.  Await pathology results.  YOU HAD AN ENDOSCOPIC PROCEDURE TODAY AT [facility]:   Refer to the procedure report that was given to you for any specific questions about what was found during the examination.  If the procedure report does not answer your questions, please call your gastroenterologist to clarify.  If you requested that your care partner not be given the details of your procedure findings, then the procedure report has been included in a sealed envelope for you to review at your convenience later.  YOU SHOULD EXPECT: Some feelings of bloating in the abdomen. Passage of more gas than usual.  Walking can help get rid of the air that was put into your GI tract during the procedure and reduce the bloating. If you had a lower endoscopy (such as a colonoscopy or flexible sigmoidoscopy) you may notice spotting of blood in your stool or on the toilet paper. If you underwent a bowel prep for your procedure, you may not have a normal bowel movement for a few days.  Please Note:  You might notice some irritation and congestion in your nose or some drainage.  This is from the oxygen used during your procedure.  There is no need for concern and it should clear up in a day or so.  SYMPTOMS TO REPORT IMMEDIATELY:   Following lower endoscopy (colonoscopy or flexible sigmoidoscopy):  Excessive amounts of blood in the stool  Significant tenderness or worsening of abdominal pains  Swelling of the abdomen that is new, acute  Fever of 100F or higher  For urgent or emergent issues, a gastroenterologist can be reached at any hour by calling [phone].   DIET:  We do recommend a small meal at first, but then you may proceed to your regular diet.  Drink plenty of fluids but you should avoid alcoholic beverages for 24  hours.  ACTIVITY:  You should plan to take it easy for the rest of today and you should NOT DRIVE or use heavy machinery until tomorrow (because of the sedation medicines used during the test).    FOLLOW UP: Our staff will call the number listed on your records the next business day following your procedure to check on you and address any questions or concerns that you may have regarding the information given to you following your procedure. If we do not reach you, we will leave a message.  However, if you are feeling well and you are not experiencing any problems, there is no need to return our call.  We will assume that you have returned to your regular daily activities without incident.  If any biopsies were taken you will be contacted by phone or by letter within the next 1-3 weeks.  Please call us at [phone] if you have not heard about the biopsies in 3 weeks.    SIGNATURES/CONFIDENTIALITY: You and/or your care partner have signed paperwork which will be entered into your electronic medical record.  These signatures attest to the fact that that the information above on your After Visit Summary has been reviewed and is understood.  Full responsibility of the confidentiality of this discharge information lies with you and/or your care-partner.

## 2018-06-01 ENCOUNTER — Telehealth: Payer: Self-pay

## 2018-06-01 NOTE — Telephone Encounter (Signed)
Name identifier, left a message. 

## 2018-06-01 NOTE — Telephone Encounter (Signed)
  Follow up Call-  Call back number 05/31/2018  Post procedure Call Back phone  # [phone]  Permission to leave phone message Yes  Some recent data might be hidden     Patient questions:  Do you have a fever, pain , or abdominal swelling? No. Pain Score  0 *  Have you tolerated food without any problems? Yes.    Have you been able to return to your normal activities? Yes.    Do you have any questions about your discharge instructions: Diet   No. Medications  No. Follow up visit  No.  Do you have questions or concerns about your Care? No.  Actions: * If pain score is 4 or above: No action needed, pain <4.

## 2019-02-28 DIAGNOSIS — W450XXA Nail entering through skin, initial encounter: Secondary | ICD-10-CM | POA: Diagnosis not present

## 2019-02-28 DIAGNOSIS — S61531A Puncture wound without foreign body of right wrist, initial encounter: Secondary | ICD-10-CM | POA: Diagnosis not present

## 2019-02-28 DIAGNOSIS — S6991XA Unspecified injury of right wrist, hand and finger(s), initial encounter: Secondary | ICD-10-CM | POA: Diagnosis not present

## 2019-02-28 DIAGNOSIS — I10 Essential (primary) hypertension: Secondary | ICD-10-CM | POA: Diagnosis not present

## 2019-02-28 DIAGNOSIS — Y9389 Activity, other specified: Secondary | ICD-10-CM | POA: Diagnosis not present

## 2019-02-28 DIAGNOSIS — Y990 Civilian activity done for income or pay: Secondary | ICD-10-CM | POA: Diagnosis not present

## 2019-05-27 ENCOUNTER — Other Ambulatory Visit: Payer: Self-pay | Admitting: Family Medicine

## 2019-05-27 NOTE — Telephone Encounter (Signed)
Pt has been advised by Earlene Plater he needs a TOC visit with a new PCP in order to continue to receive refills on medications. Pt is wanting to only see a male provider. Pt advised we do not have a male provider accepting TOC at this time and offered to contact [facility] location to see if Dr. Jerline Pain is available.

## 2019-05-27 NOTE — Telephone Encounter (Signed)
Requested medication (s) are due for refill today: yes  Requested medication (s) are on the active medication list: yes  Last refill:  04/2018  Future visit scheduled: no  Notes to clinic: review for refill Overdue for office visit  Requested Prescriptions  Pending Prescriptions Disp Refills   lisinopril-hydrochlorothiazide (ZESTORETIC) 20-25 MG tablet 90 tablet 4    Sig: Take 1 tablet by mouth daily.     Cardiovascular:  ACEI + Diuretic Combos Failed - 05/27/2019 11:41 AM      Failed - Na in normal range and within 180 days    Sodium  Date Value Ref Range Status  04/17/2018 136 135 - 145 mEq/L Final         Failed - K in normal range and within 180 days    Potassium  Date Value Ref Range Status  04/17/2018 4.2 3.5 - 5.1 mEq/L Final         Failed - Cr in normal range and within 180 days    Creatinine, Ser  Date Value Ref Range Status  04/17/2018 0.74 0.40 - 1.50 mg/dL Final         Failed - Ca in normal range and within 180 days    Calcium  Date Value Ref Range Status  04/17/2018 9.4 8.4 - 10.5 mg/dL Final         Failed - Valid encounter within last 6 months    Recent Outpatient Visits          1 year ago Preventative health care   [facility], Doretha Sou, MD   2 years ago Encounter for preventive health examination   [facility], Doretha Sou, MD   2 years ago Sore throat   [place] [facility], Alinda Sierras, MD   2 years ago Essential hypertension   [facility] [place], Doretha Sou, MD   3 years ago Encounter for preventive health examination   [facility] [place], Doretha Sou, MD             Passed - Patient is not pregnant      Passed - Last BP in normal range    BP Readings from Last 1 Encounters:  05/31/18 117/76

## 2019-05-27 NOTE — Telephone Encounter (Signed)
lisinopril-hydrochlorothiazide (PRINZIDE,ZESTORETIC) 20-25 MG tablet    Patient is requesting refill.     [id number] - [place], [place] - [address] [phone] (Phone) [phone] (Fax)

## 2019-06-04 DIAGNOSIS — E785 Hyperlipidemia, unspecified: Secondary | ICD-10-CM | POA: Diagnosis not present

## 2019-06-04 DIAGNOSIS — I10 Essential (primary) hypertension: Secondary | ICD-10-CM | POA: Diagnosis not present

## 2021-06-16 ENCOUNTER — Encounter: Payer: Self-pay | Admitting: Gastroenterology

## 2021-07-12 ENCOUNTER — Encounter: Payer: Self-pay | Admitting: Gastroenterology

## 2021-07-12 ENCOUNTER — Other Ambulatory Visit (HOSPITAL_COMMUNITY): Payer: Self-pay | Admitting: Internal Medicine

## 2021-08-11 ENCOUNTER — Other Ambulatory Visit: Payer: Self-pay

## 2021-08-11 ENCOUNTER — Ambulatory Visit (HOSPITAL_COMMUNITY)
Admission: RE | Admit: 2021-08-11 | Discharge: 2021-08-11 | Disposition: A | Discharge status: Home / Self Care | Payer: Self-pay | Source: Ambulatory Visit | Attending: Internal Medicine | Admitting: Internal Medicine

## 2021-08-11 ENCOUNTER — Ambulatory Visit: Payer: BLUE CROSS/BLUE SHIELD

## 2021-08-11 DIAGNOSIS — Z136 Encounter for screening for cardiovascular disorders: Secondary | ICD-10-CM | POA: Insufficient documentation

## 2021-09-08 ENCOUNTER — Other Ambulatory Visit: Payer: Self-pay

## 2021-09-08 ENCOUNTER — Encounter: Payer: Self-pay | Admitting: Gastroenterology

## 2021-09-08 ENCOUNTER — Ambulatory Visit (AMBULATORY_SURGERY_CENTER): Payer: BC Managed Care – PPO | Admitting: *Deleted

## 2021-09-08 VITALS — Ht 66.0 in | Wt 180.0 lb

## 2021-09-08 DIAGNOSIS — Z86010 Personal history of colonic polyps: Secondary | ICD-10-CM

## 2021-09-08 MED ORDER — NA SULFATE-K SULFATE-MG SULF 17.5-3.13-1.6 GM/177ML PO SOLN: 1.0000 | ORAL | 0 refills | Status: DC

## 2021-09-08 NOTE — Progress Notes (Signed)
Patient's pre-visit was done today over the phone with the patient. Name,DOB and address verified. Patient denies any allergies to Eggs and Soy. Patient denies any problems with anesthesia/sedation. Patient is not taking any diet pills or blood thinners. No home Oxygen.   Prep instructions sent to pt's MyChart-pt aware. Patient understands to call us back with any questions or concerns. Patient is aware of our care-partner policy and [id number] safety protocol.   The patient is COVID-19 vaccinated.

## 2021-09-22 ENCOUNTER — Encounter: Payer: Self-pay | Admitting: Gastroenterology

## 2021-09-22 ENCOUNTER — Ambulatory Visit (AMBULATORY_SURGERY_CENTER): Payer: BC Managed Care – PPO | Admitting: Gastroenterology

## 2021-09-22 ENCOUNTER — Other Ambulatory Visit: Payer: Self-pay

## 2021-09-22 VITALS — BP 125/82 | HR 72 | Temp 98.6°F | Resp 14 | Ht 66.0 in | Wt 180.0 lb

## 2021-09-22 DIAGNOSIS — D124 Benign neoplasm of descending colon: Secondary | ICD-10-CM

## 2021-09-22 DIAGNOSIS — D123 Benign neoplasm of transverse colon: Secondary | ICD-10-CM

## 2021-09-22 DIAGNOSIS — Z86010 Personal history of colonic polyps: Secondary | ICD-10-CM

## 2021-09-22 MED ORDER — SODIUM CHLORIDE 0.9 % IV SOLN: 500.0000 mL | Freq: Once | INTRAVENOUS | Status: DC

## 2021-09-22 NOTE — Progress Notes (Signed)
[facility] History and Physical   Primary Care Physician:  Adin Hector, MD   Reason for Procedure:   History of colon polyps  Plan:    colonoscopy     HPI: Preston Williams is a 56 y.o. male  here for colonoscopy surveillance - 5 adenomas removed 05/2018. Patient denies any bowel symptoms at this time. No first degree family history of colon cancer known. Otherwise feels well without any cardiopulmonary symptoms.    Past Medical History:  Diagnosis Date   Hyperlipidemia    Hypertension     Past Surgical History:  Procedure Laterality Date   COLONOSCOPY  05/31/2018   Dr.Homero Hyson   fx of wrist     POLYPECTOMY      Prior to Admission medications   Medication Sig Start Date End Date Taking? Authorizing Provider  ibuprofen (ADVIL) 200 MG tablet Take 200 mg by mouth every 6 (six) hours as needed.   Yes [provider]  lisinopril-hydrochlorothiazide (PRINZIDE,ZESTORETIC) 20-25 MG tablet Take 1 tablet by mouth daily. 04/17/18  Yes Marletta Lor, MD    Current Outpatient Medications  Medication Sig Dispense Refill   ibuprofen (ADVIL) 200 MG tablet Take 200 mg by mouth every 6 (six) hours as needed.     lisinopril-hydrochlorothiazide (PRINZIDE,ZESTORETIC) 20-25 MG tablet Take 1 tablet by mouth daily. 90 tablet 4   Current Facility-Administered Medications  Medication Dose Route Frequency Provider Last Rate Last Admin   0.9 %  sodium chloride infusion  500 mL Intravenous Once Reeder Brisby, Carlota Raspberry, MD        Allergies as of 09/22/2021 - Review Complete 09/22/2021  Allergen Reaction Noted   Peanut-containing drug products Other (See Comments) 08/17/2021    Family History  Problem Relation Age of Onset   Stroke Mother    Hypertension Mother    Heart disease Father        cabg   Colon polyps Father    Colon cancer Maternal Grandmother    Esophageal cancer Neg Hx    Stomach cancer Neg Hx    Rectal cancer Neg Hx     Social History    Socioeconomic History   Marital status: Married    Spouse name: Not on file   Number of children: Not on file   Years of education: Not on file   Highest education level: Not on file  Occupational History   Not on file  Tobacco Use   Smoking status: Never   Smokeless tobacco: Never  Vaping Use   Vaping Use: Never used  Substance and Sexual Activity   Alcohol use: Yes    Alcohol/week: 5.0 standard drinks    Types: 5 Standard drinks or equivalent per week   Drug use: No   Sexual activity: Yes  Other Topics Concern   Not on file  Social History Narrative   Not on file   Social Determinants of Health   Financial Resource Strain: Not on file  Food Insecurity: Not on file  Transportation Needs: Not on file  Physical Activity: Not on file  Stress: Not on file  Social Connections: Not on file  Intimate Partner Violence: Not on file    Review of Systems: All other review of systems negative except as mentioned in the HPI.  Physical Exam: Vital signs BP 112/84    Pulse 73    Temp 98.6 F (37 C) (Skin)    Resp 14    Ht 5\' 6"  (1.676 m)  Wt 180 lb (81.6 kg)    SpO2 96%    BMI 29.05 kg/m   General:   Alert,  Well-developed, pleasant and cooperative in NAD Lungs:  Clear throughout to auscultation.   Heart:  Regular rate and rhythm Abdomen:  Soft, nontender and nondistended.   Neuro/Psych:  Alert and cooperative. Normal mood and affect. A and O x 3  Jolly Mango, MD [facility]

## 2021-09-22 NOTE — Progress Notes (Signed)
A and O x3. Report to RN. Tolerated MAC anesthesia well. 

## 2021-09-22 NOTE — Op Note (Signed)
[place] Patient Name: Preston Williams Procedure Date: 09/22/2021 7:51 AM MRN: [id number] Endoscopist: Remo Lipps P. Havery Moros , MD Age: 56 Referring MD:  Date of Birth: March 07, 1966 Gender: Male Account #: [id number] Procedure:                Colonoscopy Indications:              High risk colon cancer surveillance: Personal                            history of colonic polyps - last exam 05/2018 - 5                            adenomas Medicines:                Monitored Anesthesia Care Procedure:                Pre-Anesthesia Assessment:                           - Prior to the procedure, a History and Physical                            was performed, and patient medications and                            allergies were reviewed. The patient's tolerance of                            previous anesthesia was also reviewed. The risks                            and benefits of the procedure and the sedation                            options and risks were discussed with the patient.                            All questions were answered, and informed consent                            was obtained. Prior Anticoagulants: The patient has                            taken no previous anticoagulant or antiplatelet                            agents. ASA Grade Assessment: II - A patient with                            mild systemic disease. After reviewing the risks                            and benefits, the patient was deemed in  satisfactory condition to undergo the procedure.                           After obtaining informed consent, the colonoscope                            was passed under direct vision. Throughout the                            procedure, the patient's blood pressure, pulse, and                            oxygen saturations were monitored continuously. The                            Olympus CF-[id number]) Colonoscope was                             introduced through the anus and advanced to the the                            cecum, identified by appendiceal orifice and                            ileocecal valve. The colonoscopy was performed                            without difficulty. The patient tolerated the                            procedure well. The quality of the bowel                            preparation was good. The ileocecal valve,                            appendiceal orifice, and rectum were photographed. Scope In: 8:04:51 AM Scope Out: 8:18:24 AM Scope Withdrawal Time: 0 hours 11 minutes 32 seconds  Total Procedure Duration: 0 hours 13 minutes 33 seconds  Findings:                 The perianal and digital rectal examinations were                            normal.                           A 3 mm polyp was found in the transverse colon. The                            polyp was sessile. The polyp was removed with a                            cold snare. Resection and retrieval were complete.  A 2 mm polyp was found in the descending colon. The                            polyp was sessile. The polyp was removed with a                            cold snare. Resection and retrieval were complete.                           Internal hemorrhoids were found during retroflexion.                           The exam was otherwise without abnormality. Complications:            No immediate complications. Estimated blood loss:                            Minimal. Estimated Blood Loss:     Estimated blood loss was minimal. Impression:               - One 3 mm polyp in the transverse colon, removed                            with a cold snare. Resected and retrieved.                           - One 2 mm polyp in the descending colon, removed                            with a cold snare. Resected and retrieved.                           - Internal hemorrhoids.                            - The examination was otherwise normal. Recommendation:           - Patient has a contact number available for                            emergencies. The signs and symptoms of potential                            delayed complications were discussed with the                            patient. Return to normal activities tomorrow.                            Written discharge instructions were provided to the                            patient.                           -  Resume previous diet.                           - Continue present medications.                           - Await pathology results. Remo Lipps P. Kenon Delashmit, MD 09/22/2021 8:22:46 AM This report has been signed electronically.

## 2021-09-22 NOTE — Progress Notes (Signed)
Called to room to assist during endoscopic procedure.  Patient ID and intended procedure confirmed with present staff. Received instructions for my participation in the procedure from the performing physician.  

## 2021-09-22 NOTE — Patient Instructions (Signed)
Please read handouts provided. Continue present medications. Await pathology results.   YOU HAD AN ENDOSCOPIC PROCEDURE TODAY AT THE [facility]:   Refer to the procedure report that was given to you for any specific questions about what was found during the examination.  If the procedure report does not answer your questions, please call your gastroenterologist to clarify.  If you requested that your care partner not be given the details of your procedure findings, then the procedure report has been included in a sealed envelope for you to review at your convenience later.  YOU SHOULD EXPECT: Some feelings of bloating in the abdomen. Passage of more gas than usual.  Walking can help get rid of the air that was put into your GI tract during the procedure and reduce the bloating. If you had a lower endoscopy (such as a colonoscopy or flexible sigmoidoscopy) you may notice spotting of blood in your stool or on the toilet paper. If you underwent a bowel prep for your procedure, you may not have a normal bowel movement for a few days.  Please Note:  You might notice some irritation and congestion in your nose or some drainage.  This is from the oxygen used during your procedure.  There is no need for concern and it should clear up in a day or so.  SYMPTOMS TO REPORT IMMEDIATELY:  Following lower endoscopy (colonoscopy or flexible sigmoidoscopy):  Excessive amounts of blood in the stool  Significant tenderness or worsening of abdominal pains  Swelling of the abdomen that is new, acute  Fever of 100F or higher   For urgent or emergent issues, a gastroenterologist can be reached at any hour by calling [phone]. Do not use MyChart messaging for urgent concerns.    DIET:  We do recommend a small meal at first, but then you may proceed to your regular diet.  Drink plenty of fluids but you should avoid alcoholic beverages for 24 hours.  ACTIVITY:  You should plan to take it easy  for the rest of today and you should NOT DRIVE or use heavy machinery until tomorrow (because of the sedation medicines used during the test).    FOLLOW UP: Our staff will call the number listed on your records 48-72 hours following your procedure to check on you and address any questions or concerns that you may have regarding the information given to you following your procedure. If we do not reach you, we will leave a message.  We will attempt to reach you two times.  During this call, we will ask if you have developed any symptoms of COVID 19. If you develop any symptoms (ie: fever, flu-like symptoms, shortness of breath, cough etc.) before then, please call [phone].  If you test positive for Covid 19 in the 2 weeks post procedure, please call and report this information to us.    If any biopsies were taken you will be contacted by phone or by letter within the next 1-3 weeks.  Please call us at [phone] if you have not heard about the biopsies in 3 weeks.    SIGNATURES/CONFIDENTIALITY: You and/or your care partner have signed paperwork which will be entered into your electronic medical record.  These signatures attest to the fact that that the information above on your After Visit Summary has been reviewed and is understood.  Full responsibility of the confidentiality of this discharge information lies with you and/or your care-partner.  

## 2021-09-22 NOTE — Progress Notes (Signed)
Pt's states no medical or surgical changes since previsit or office visit. 

## 2021-09-24 ENCOUNTER — Telehealth: Payer: Self-pay | Admitting: *Deleted

## 2021-09-24 NOTE — Telephone Encounter (Signed)
°  Follow up Call-  Call back number 09/22/2021  Post procedure Call Back phone  # [phone]  Permission to leave phone message Yes  Some recent data might be hidden     Patient questions:  Do you have a fever, pain , or abdominal swelling? No. Pain Score  0 *  Have you tolerated food without any problems? Yes.    Have you been able to return to your normal activities? Yes.    Do you have any questions about your discharge instructions: Diet   No. Medications  No. Follow up visit  No.  Do you have questions or concerns about your Care? No.  Actions: * If pain score is 4 or above: No action needed, pain <4.

## 2022-01-12 ENCOUNTER — Other Ambulatory Visit: Payer: Self-pay | Admitting: Internal Medicine

## 2022-01-12 DIAGNOSIS — I10 Essential (primary) hypertension: Secondary | ICD-10-CM

## 2022-01-12 DIAGNOSIS — R222 Localized swelling, mass and lump, trunk: Secondary | ICD-10-CM

## 2022-01-18 ENCOUNTER — Ambulatory Visit
Admission: RE | Admit: 2022-01-18 | Discharge: 2022-01-18 | Disposition: A | Discharge status: Home / Self Care | Payer: BC Managed Care – PPO | Source: Ambulatory Visit | Attending: Internal Medicine | Admitting: Internal Medicine

## 2022-01-18 DIAGNOSIS — R222 Localized swelling, mass and lump, trunk: Secondary | ICD-10-CM

## 2022-01-18 DIAGNOSIS — I10 Essential (primary) hypertension: Secondary | ICD-10-CM

## 2022-01-18 MED ORDER — IOPAMIDOL (ISOVUE-300) INJECTION 61%
100.0000 mL | Freq: Once | INTRAVENOUS | Status: AC | PRN
  Administered 2022-01-18: 100 mL via INTRAVENOUS

## 2023-03-21 ENCOUNTER — Encounter: Payer: Self-pay | Admitting: Urology

## 2023-03-21 ENCOUNTER — Ambulatory Visit (INDEPENDENT_AMBULATORY_CARE_PROVIDER_SITE_OTHER): Payer: BC Managed Care – PPO | Admitting: Urology

## 2023-03-21 VITALS — BP 142/70 | HR 77 | Ht 66.0 in | Wt 185.0 lb

## 2023-03-21 DIAGNOSIS — Z125 Encounter for screening for malignant neoplasm of prostate: Secondary | ICD-10-CM | POA: Diagnosis not present

## 2023-03-21 DIAGNOSIS — Z3009 Encounter for other general counseling and advice on contraception: Secondary | ICD-10-CM

## 2023-03-21 MED ORDER — DIAZEPAM 5 MG PO TABS: 5.0000 mg | ORAL_TABLET | Freq: Once | ORAL | 0 refills | Status: DC | PRN

## 2023-03-21 NOTE — Patient Instructions (Signed)
 Pre-Vasectomy Instructions  STOP all aspirin or blood thinners (Aspirin, Plavix, Coumadin, Warfarin, Motrin, Ibuprofen, Advil, Aleve, Naproxen, Naprosyn) for 7 days prior to the procedure.  If you have any questions about stopping these medications please contact your primary care physician or cardiologist.  Shave all hair from the upper scrotum on the day of the procedure.  This means just under the penis onto the scrotal sac.  The area shaved should measure about 2-3 inches around.  You may lather the scrotum with soap and water, and shave with a safety razor.  After shaving the area, thoroughly wash the penis and the scrotum, then shower or bathe to remove all the loose hairs.  If needed, wash the area again just before coming in for your Vasectomy.  It is recommended to have a light meal an hour or so prior to the procedure.  Bring a scrotal support (jock strap or suspensory, or tight jockey shorts or underwear).  Wear comfortable pants or shorts.  While the actual procedure usually takes about 45 minutes, you should be prepared to stay in the office for approximately one hour.  Bring someone with you to drive you home.  If you have any questions or concerns, please feel free to call the office at [phone].  Vasectomy Vasectomy is a procedure to cut and then tie or burn the ends of the vas deferens. The vas deferens is a tube that carries sperm from the testicle to the urethra. This procedure blocks sperm from being released during sex. This ensures that sperm does not go into the vagina. A vasectomy does not affect your ability to have sex or your desire for sex. Also, it does not prevent sexually transmitted infections, or STIs. Vasectomy is a permanent and effective form of birth control. You should have a vasectomy only when you and your partner are sure you do not want children in the future. Do not get this procedure when you are stressed, such as after divorce or pregnancy  loss. Tell a health care provider about: Any allergies you have. All medicines you are taking. These include vitamins, herbs, eye drops, creams, and over-the-counter medicines. Any problems you or family members have had with anesthesia. Any bleeding problems you have. Any surgeries you have had. Any medical conditions you have. What are the risks? Your provider will talk with you about risks. These may include: Infection. Bleeding and swelling of the scrotum. The scrotum is the sac that contains the testicles. Allergies to medicines. Failure of the procedure to prevent pregnancy. There is a very small chance that the tied or burned parts of the vas deferens may reconnect. If this happens, you could still make a person pregnant. Pain in the scrotum that goes on after you heal from the procedure. What happens before the procedure? Medicines Ask your health care provider about: Changing or stopping your regular medicines. These include any diabetes medicines or blood thinners you take. Taking medicines such as aspirin and ibuprofen. These medicines can thin your blood. Do not take them unless your provider tells you to take them. Taking over-the-counter medicines, vitamins, herbs, and supplements. You may be told to take a sedative a few hours before the procedure. A sedative helps you relax. Surgery safety Ask your provider: How your surgery site will be marked. What steps will be taken to help prevent infection. These steps may include: Removing hair at the surgery site. Washing skin with a soap that kills germs. Taking antibiotics. General instructions Do  not use any products that contain nicotine or tobacco for at least 4 weeks before the procedure. These products include cigarettes, chewing tobacco, and vaping devices, such as e-cigarettes. If you need help quitting, ask your provider. If you'll be going home right after the procedure, plan to have a responsible adult: Take you  home from the hospital or clinic. You'll not be allowed to drive. Care for you for the time you are told. What happens during the procedure?  You may be given: A sedative. This helps you relax. You may also be told to take this a few hours before the procedure. Anesthesia. This keeps you from feeling pain. It will numb certain areas of your body. Your provider will feel for your vas deferens. To get to the vas deferens, your provider may: Make a very small cut, or incision, in your scrotum. Make a hole by piercing the scrotum. Your vas deferens will be pulled out of your scrotum and cut. To close it, the cut ends of the vas deferens will be tied or burned. The vas deferens will be put back into your scrotum. The cut or the hole in the scrotum will be closed with stitches. The stitches will dissolve and will not need to be removed. The procedure will be done again on the other side of your scrotum. The procedure may vary among providers and hospitals. What happens after the procedure? You will be monitored to make sure that you do not have problems. You will be asked not to ejaculate for at least 1 week after the procedure, or for as long as you are told. You will need to use another form of birth control for 2-4 months after the procedure. Do this until your provider confirms that there's no sperm in your semen. You may be given something to wear to support your scrotum. This includes a jockstrap or underwear with a pouch. If you were given a sedative during your procedure, do not drive or use machines until your provider says that it's safe. This information is not intended to replace advice given to you by your health care provider. Make sure you discuss any questions you have with your health care provider. Document Revised: 09/27/2022 Document Reviewed: 09/27/2022 Elsevier Patient Education  2024 [employer].

## 2023-03-21 NOTE — Progress Notes (Signed)
   03/21/23 3:00 PM   Preston Williams January 13, 1966 [id number]  CC: Discuss vasectomy, PSA screening  HPI: Healthy 57 year old male here today to discuss vasectomy.  He and his wife are not interested in further biologic pregnancies.  His wife recently had IUD removed.  Has had PSA screening through PCP, most recent PSA was normal at 0.35 from November 2023.    Family History: Family History  Problem Relation Age of Onset   Stroke Mother    Hypertension Mother    Heart disease Father        cabg   Colon polyps Father    Colon cancer Maternal Grandmother    Esophageal cancer Neg Hx    Stomach cancer Neg Hx    Rectal cancer Neg Hx     Social History:  reports that he has never smoked. He has never used smokeless tobacco. He reports current alcohol use of about 5.0 standard drinks of alcohol per week. He reports that he does not use drugs.  Physical Exam: BP (!) 142/70 (BP Location: Left Arm, Patient Position: Sitting, Cuff Size: Large)   Pulse 77   Ht 5\' 6"  (1.676 m)   Wt 185 lb (83.9 kg)   BMI 29.86 kg/m    Constitutional:  Alert and oriented, No acute distress. Cardiovascular: No clubbing, cyanosis, or edema. Respiratory: Normal respiratory effort, no increased work of breathing. GI: Abdomen is soft, nontender, nondistended, no abdominal masses GU: Circumcised phallus with patent meatus, no lesions, testicles 20 cc and descended bilaterally, vas deferens easily palpable bilaterally  Laboratory Data: See HPI  Assessment & Plan:   Healthy 57 year old male interested in vasectomy for permanent sterilization.  PSA normal, 0.35.  We discussed the risks and benefits of vasectomy at length.  Vasectomy is intended to be a permanent form of contraception, and does not produce immediate sterility.  Following vasectomy another form of contraception is required until vas occlusion is confirmed by a post-vasectomy semen analysis obtained 2-3 months after the procedure.  Even after  vas occlusion is confirmed, vasectomy is not 100% reliable in preventing pregnancy, and the failure rate is approximately 08/1998.  Repeat vasectomy is required in less than 1% of patients.  He should refrain from ejaculation for 1 week after vasectomy.  Options for fertility after vasectomy include vasectomy reversal, and sperm retrieval with in vitro fertilization or ICSI.  These options are not always successful and may be expensive.  Finally, there are other permanent and non-permanent alternatives to vasectomy available. There is no risk of erectile dysfunction, and the volume of semen will be similar to prior, as the majority of the ejaculate is from the prostate and seminal vesicles.   The procedure takes ~20 minutes.  We recommend patients take 5-10 mg of Valium 30 minutes prior, and he will need a driver post-procedure.  Local anesthetic is injected into the scrotal skin and a small segment of the vas deferens is removed, and the ends occluded. The complication rate is approximately 1-2%, and includes bleeding, infection, and development of chronic scrotal pain.  Risks and benefits of PSA screening were reviewed.  Reassurance provided regarding normal PSA value of 0.35.  Can continue screening every 2 to 4 years with PCP per the guideline recommendations.  PLAN: Schedule vasectomy Valium sent to pharmacy Continue PSA screening every 2 to 4 years  Legrand Rams, MD 03/21/2023  [facility] [address] [place], [place] [address] [phone]

## 2023-04-04 ENCOUNTER — Encounter: Payer: BC Managed Care – PPO | Admitting: Urology

## 2023-04-11 ENCOUNTER — Ambulatory Visit (INDEPENDENT_AMBULATORY_CARE_PROVIDER_SITE_OTHER): Payer: BC Managed Care – PPO | Admitting: Urology

## 2023-04-11 ENCOUNTER — Encounter: Payer: Self-pay | Admitting: Urology

## 2023-04-11 VITALS — BP 155/83 | HR 84

## 2023-04-11 DIAGNOSIS — Z302 Encounter for sterilization: Secondary | ICD-10-CM | POA: Diagnosis not present

## 2023-04-11 NOTE — Progress Notes (Signed)
VASECTOMY PROCEDURE NOTE:  The patient was taken to the minor procedure room and placed in the supine position. His genitals were prepped and draped in the usual sterile fashion. The right vas deferens was brought up to the skin of the right upper scrotum. The skin overlying it was anesthetized with 1% lidocaine without epinephrine, anesthetic was also injected alongside the vas deferens in the direction of the inguinal canal. The no scalpel vasectomy instrument was used to make a small perforation in the scrotal skin. The vasectomy clamp was used to grasp the vas deferens. It was carefully dissected free from surrounding structures. A 1cm segment of the vas was removed, and the cut ends of the mucosa were cauterized. No significant bleeding was noted. The vas deferens was returned to the scrotum. The skin incision was closed with a simple interrupted stitch of 4-0 chromic.  Attention was then turned to the left side. The left vasectomy was performed in the same exact fashion. Sterile dressings were placed over each incision. The patient tolerated the procedure well.  IMPRESSION/DIAGNOSIS: The patient is a 58 year old gentleman who underwent a vasectomy today. Post-procedure instructions were reviewed. I stressed the importance of continuing to use birth control until he provides a semen specimen more than 2 months from now that demonstrates azoospermia.  We discussed return precautions including fever over 101, significant bleeding or hematoma, or uncontrolled pain. I also stressed the importance of avoiding strenuous activity for one week, no sexual activity or ejaculations for 5 days, intermittent icing over the next 48 hours, and scrotal support.   PLAN: The patient will be advised of his semen analysis results when available.  Legrand Rams, MD 04/11/2023

## 2023-04-11 NOTE — Patient Instructions (Signed)

## 2023-07-11 ENCOUNTER — Other Ambulatory Visit: Payer: BC Managed Care – PPO

## 2023-07-11 DIAGNOSIS — Z302 Encounter for sterilization: Secondary | ICD-10-CM

## 2023-07-12 LAB — POST-VAS SPERM EVALUATION,QUAL: Volume: 2.3 mL

## 2023-12-06 IMAGING — CT CT ABD-PELV W/ CM
1 of 3 series · 14 of 32 positions shown, 19 images · IV contrast (agent unspecified)
Comparison: None Available.

CLINICAL DATA: Superior umbilical abdominal wall mass

EXAM:
CT ABDOMEN AND PELVIS WITH CONTRAST
TECHNIQUE: Multidetector CT imaging of the abdomen and pelvis was performed
using the standard protocol following bolus administration of
intravenous contrast.

[Series 2: a/p w/ 5mm · axial · 0.83mm/px · z∈[-487,-12]mm · 14 of 107 slices shown, 19 images]
[im 6/107  soft-tissue]
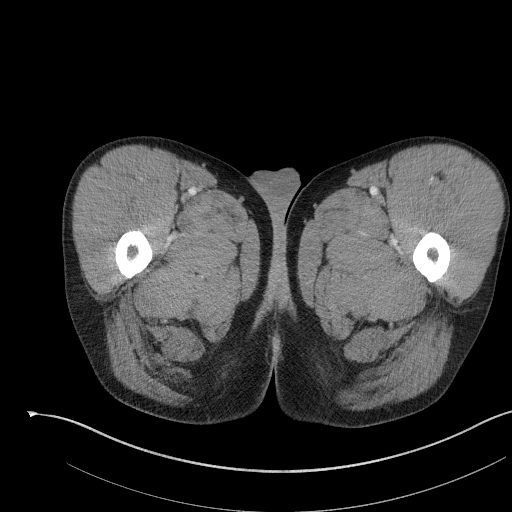
[im 6/107  bone]
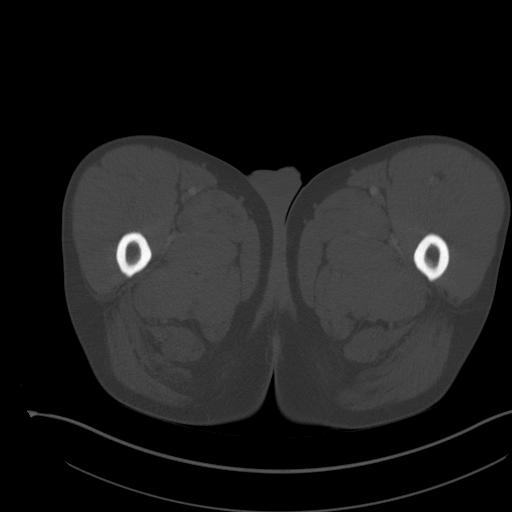
[im 17/107  soft-tissue]
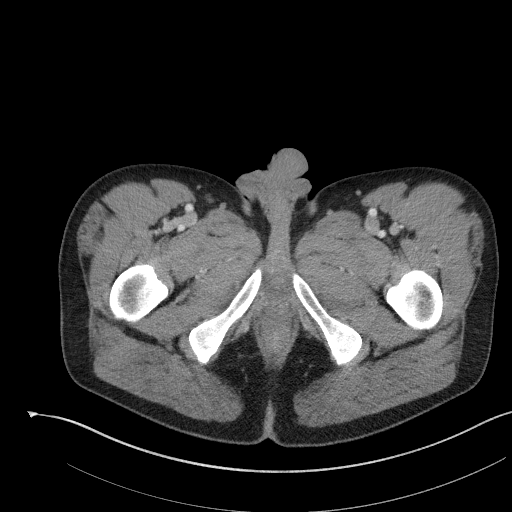
[im 23/107  soft-tissue]
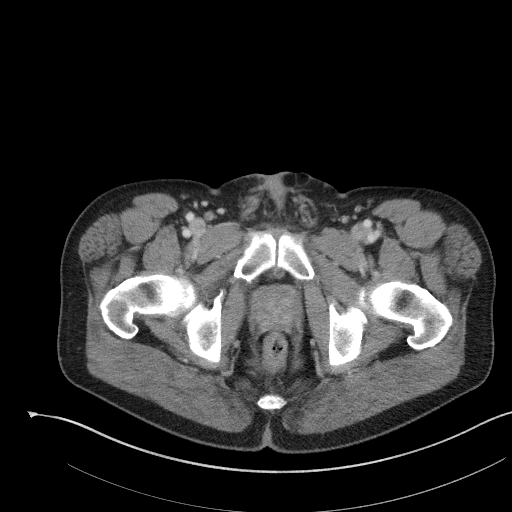
[im 28/107  soft-tissue]
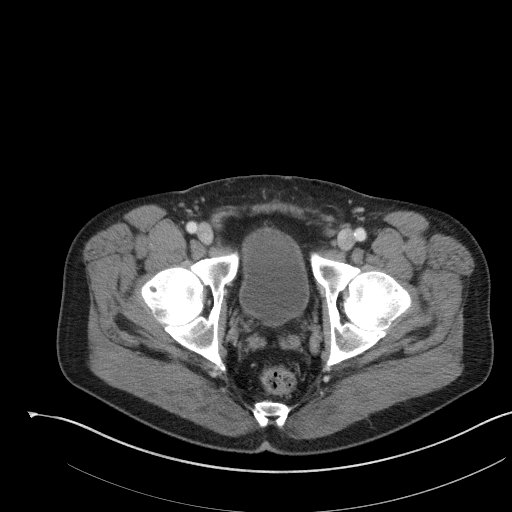
[im 40/107  soft-tissue]
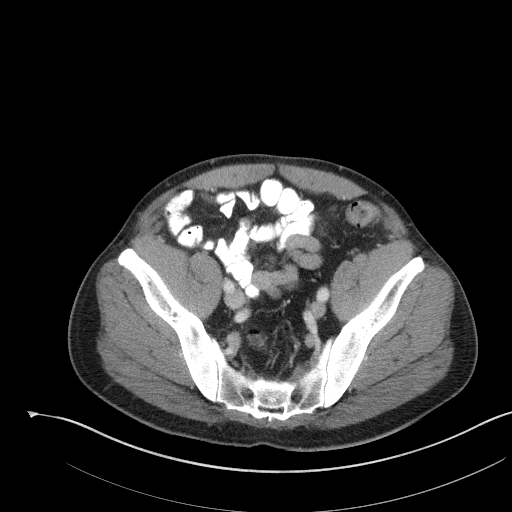
[im 45/107  soft-tissue]
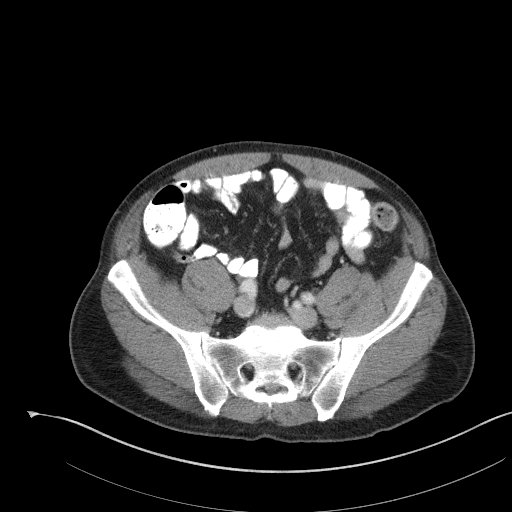
[im 56/107  soft-tissue]
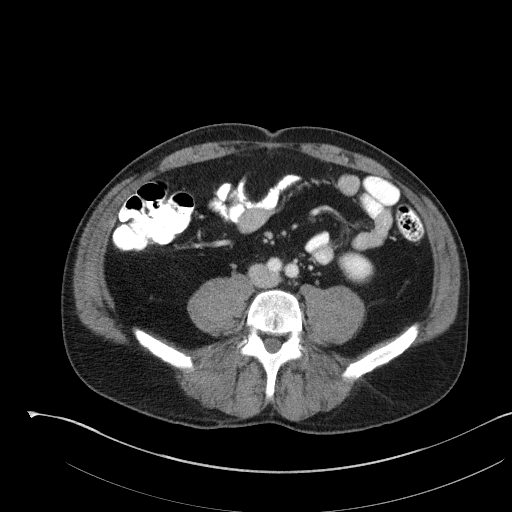
[im 62/107  soft-tissue]
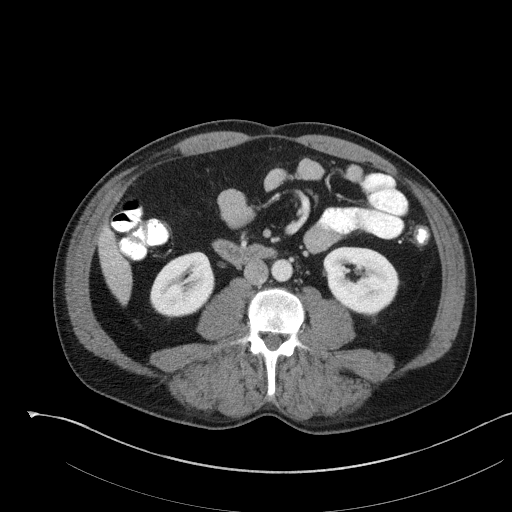
[im 67/107  soft-tissue]
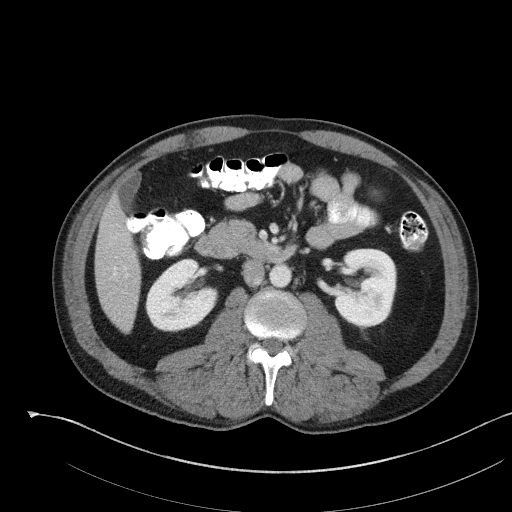
[im 67/107  bone]
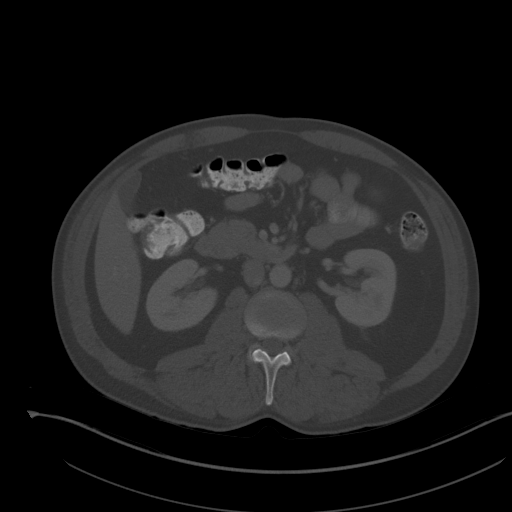
[im 79/107  soft-tissue]
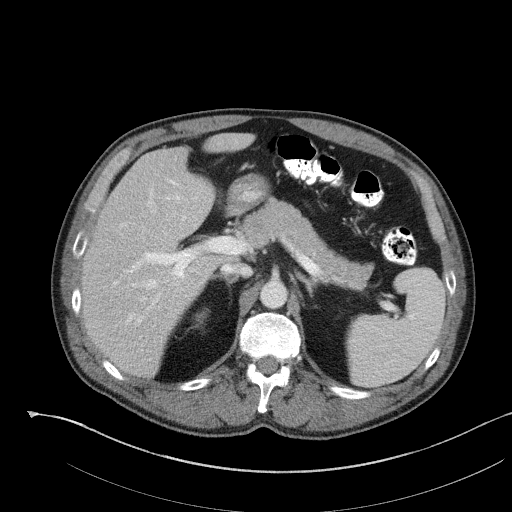
[im 84/107  soft-tissue]
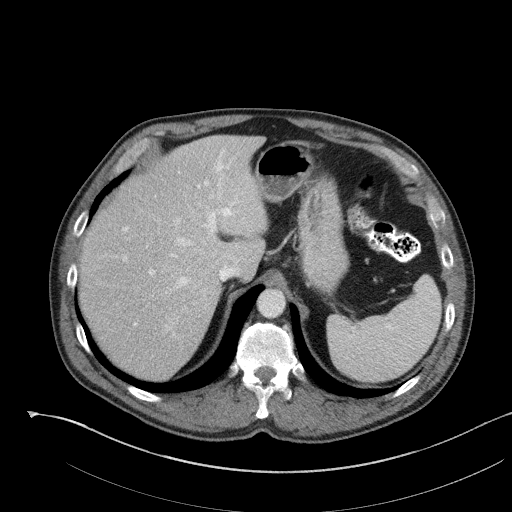
[im 84/107  lung]
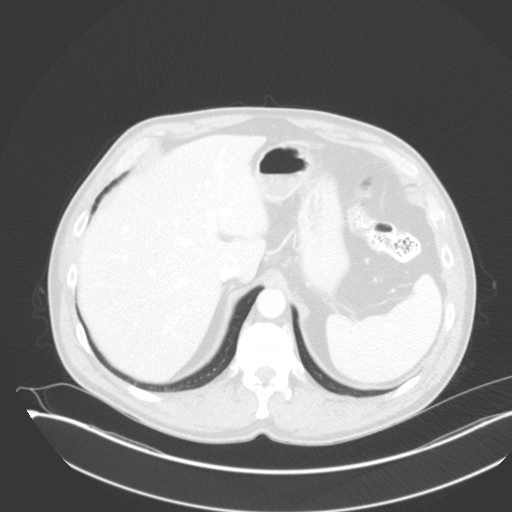
[im 90/107  soft-tissue]
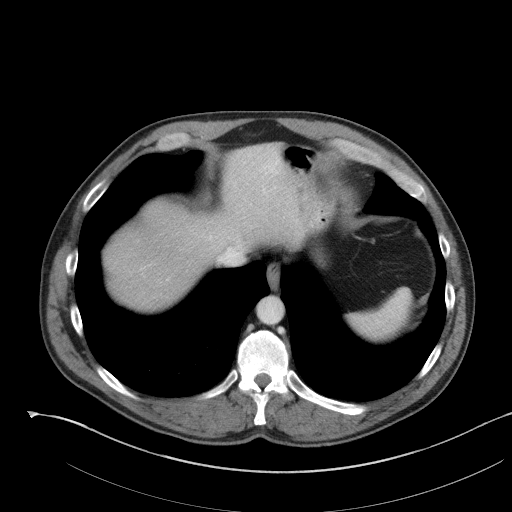
[im 90/107  lung]
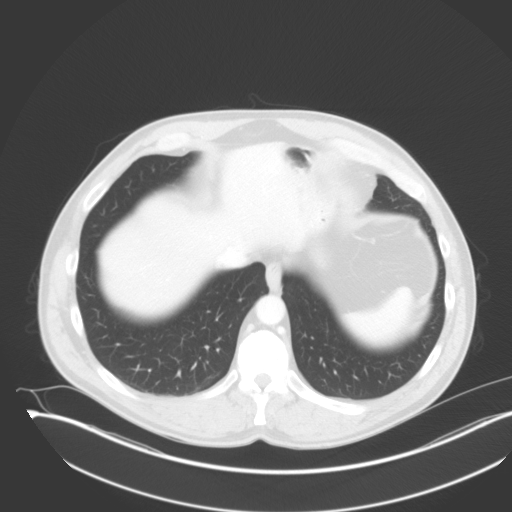
[im 95/107  lung]
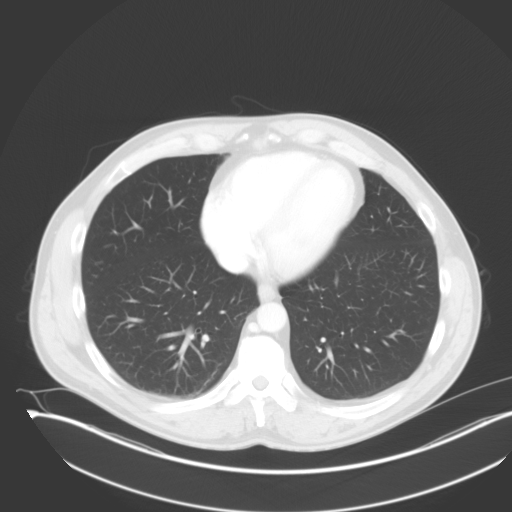
[im 101/107  soft-tissue]
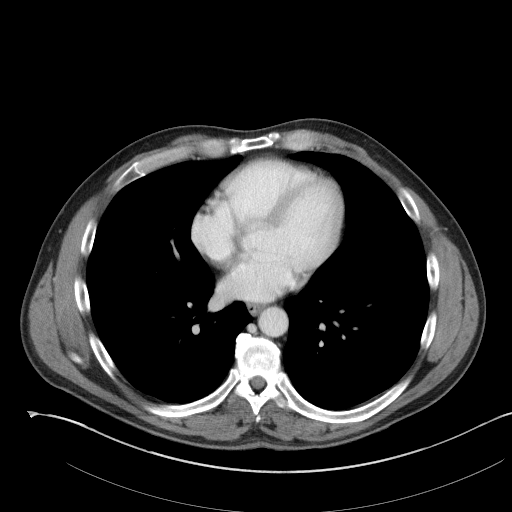
[im 101/107  lung]
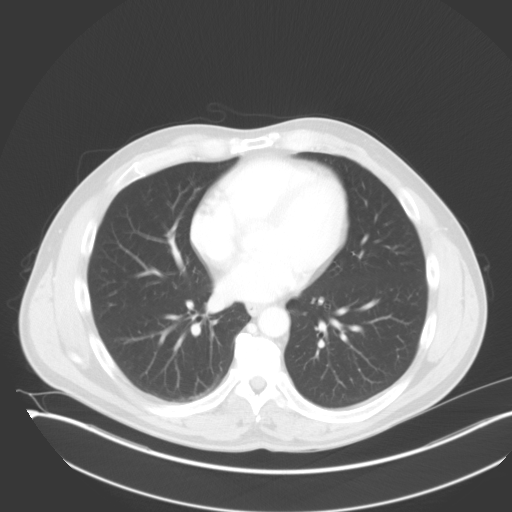

[14 of 32 positions shown; findings below may reference images not displayed]

RADIATION DOSE REDUCTION: This exam was performed according to the
departmental dose-optimization program which includes automated
exposure control, adjustment of the mA and/or kV according to
patient size and/or use of iterative reconstruction technique.

CONTRAST:  100mL [id number] IOPAMIDOL ([id number]) INJECTION 61%
FINDINGS: Lower chest: No acute abnormality.

Hepatobiliary: No focal liver abnormality is seen. No gallstones,
gallbladder wall thickening, or biliary dilatation.

Pancreas: Unremarkable. No pancreatic ductal dilatation or
surrounding inflammatory changes.

Spleen: Normal in size without focal abnormality.

Adrenals/Urinary Tract: Adrenal glands are unremarkable. Kidneys are
normal, without renal calculi, focal lesion, or hydronephrosis.
Bladder is unremarkable.

Stomach/Bowel: Stomach is within normal limits. Appendix appears
normal. No evidence of bowel wall thickening, distention, or
inflammatory changes. Colonic diverticulosis.

Vascular/Lymphatic: No significant vascular findings are present. No
enlarged abdominal or pelvic lymph nodes.

Reproductive: Prostate is unremarkable.

Other: In the region of interest at the anterior abdominal wall
superior to the umbilicus just to the right of midline, there is a 7
mm fat containing hernia. No ascites.

Musculoskeletal: No acute or significant osseous findings.
IMPRESSION: 1. Tiny supraumbilical ventral abdominal wall hernia containing fat.
2. Colonic diverticulosis.
